# Patient Record
Sex: Male | Born: 1995 | Hispanic: No | Marital: Single | Smoking: Current some day smoker
Health system: Southern US, Community
[De-identification: ages and names within clinical notes are randomized; demographics above are authoritative.]

## PROBLEM LIST (undated history)

## (undated) DIAGNOSIS — J45909 Unspecified asthma, uncomplicated: Secondary | ICD-10-CM

## (undated) DIAGNOSIS — J982 Interstitial emphysema: Principal | ICD-10-CM

## (undated) DIAGNOSIS — E876 Hypokalemia: Secondary | ICD-10-CM

## (undated) HISTORY — PX: ESOPHAGOGASTRODUODENOSCOPY: SHX1529

---
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
## 2018-03-19 IMAGING — CT CT CHEST W/ CM
2 of 3 series · 15 of 36 positions shown, 18 images · IV contrast (APPLIED)
Comparison: Chest radiograph April 08, 2016

CLINICAL DATA: Recent respiratory tract infection, vomited today.
Severe chest and LEFT neck pain, throat swelling. Follow-up
pneumomediastinum.

EXAM:
CT NECK WITH CONTRAST
CT CHEST WITH CONTRAST
TECHNIQUE: Multidetector CT imaging of the neck and chest was performed using
the standard protocol following the bolus administration of
intravenous contrast.
CONTRAST:  75mL EQ59IC-H99 IOPAMIDOL (EQ59IC-H99) INJECTION 61%

[Series 4: thorax · axial · 0.62mm/px · z∈[-464,-192]mm · 12 of 160 slices shown, 15 images]
[im 12/160  mediastinal]
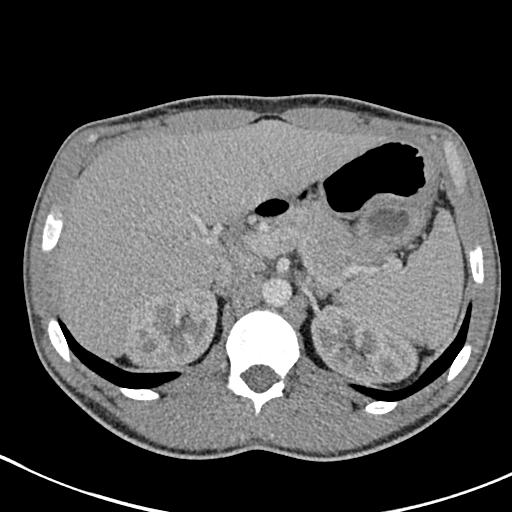
[im 12/160  lung]
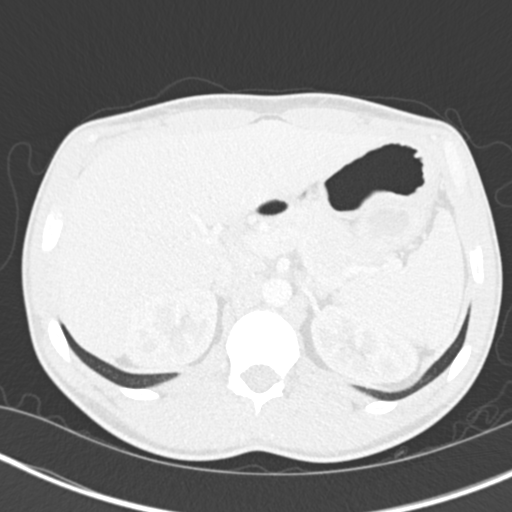
[im 24/160  lung]
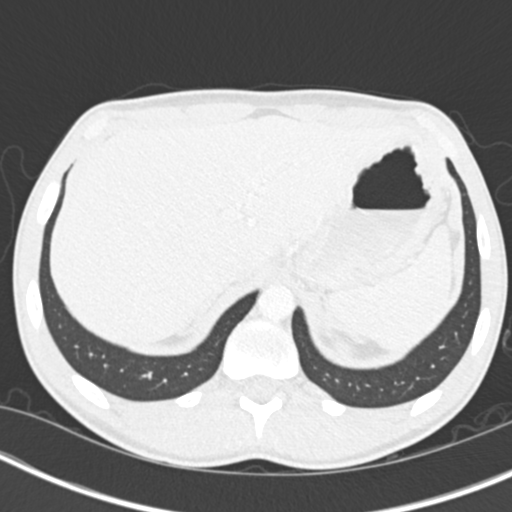
[im 36/160  lung]
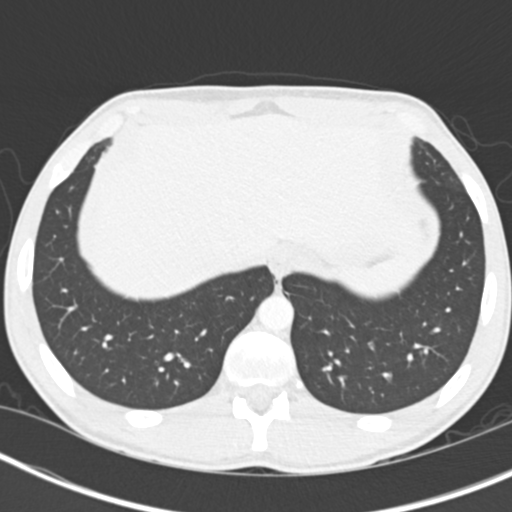
[im 48/160  lung]
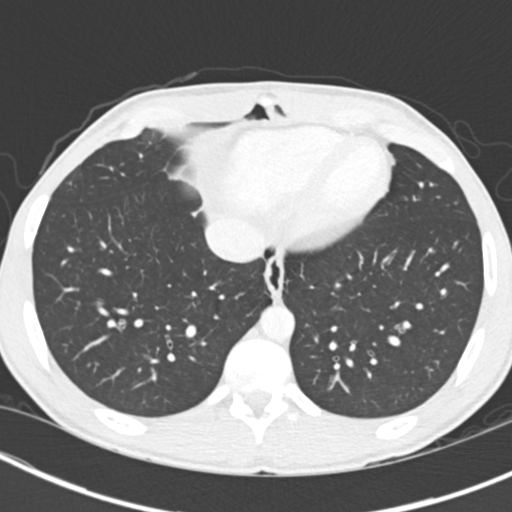
[im 59/160  mediastinal]
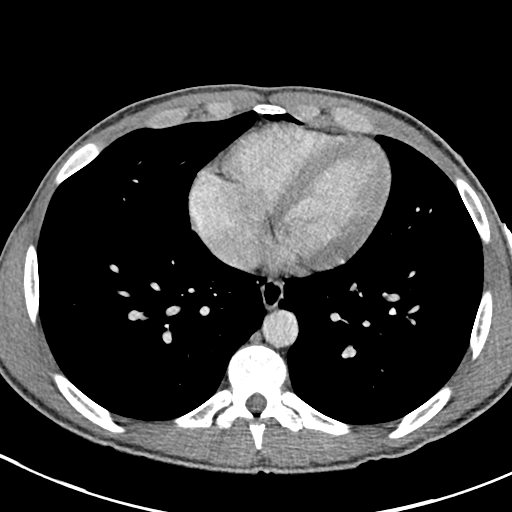
[im 59/160  lung]
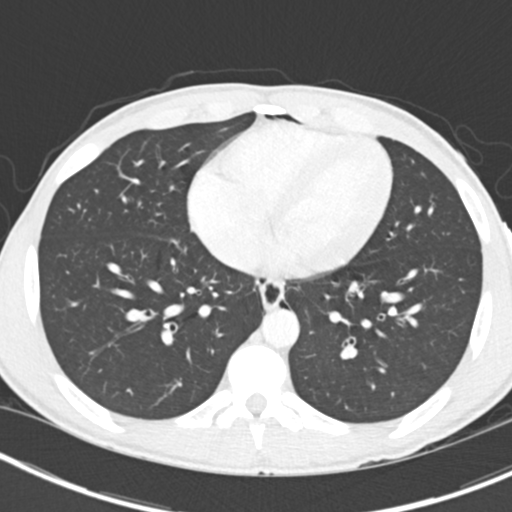
[im 71/160  lung]
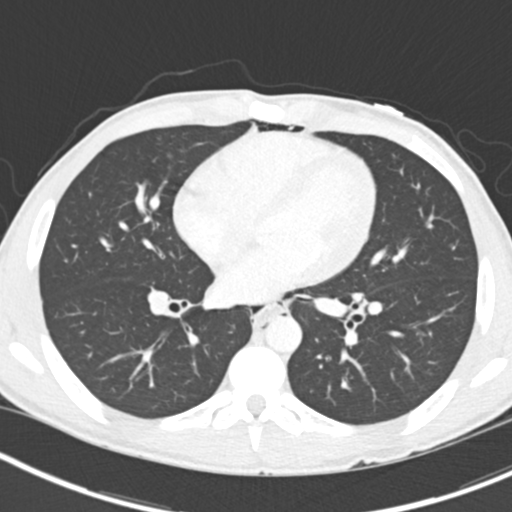
[im 89/160  lung]
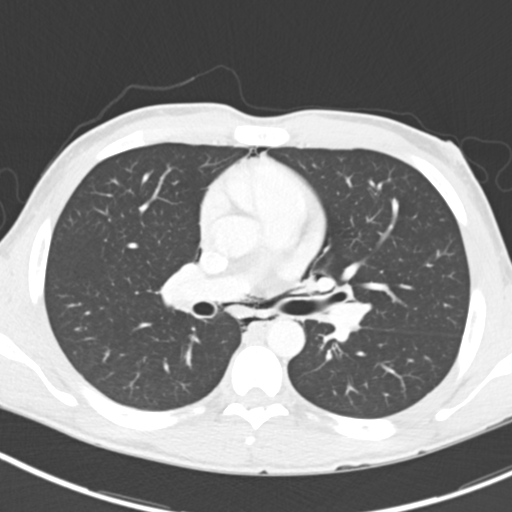
[im 101/160  lung]
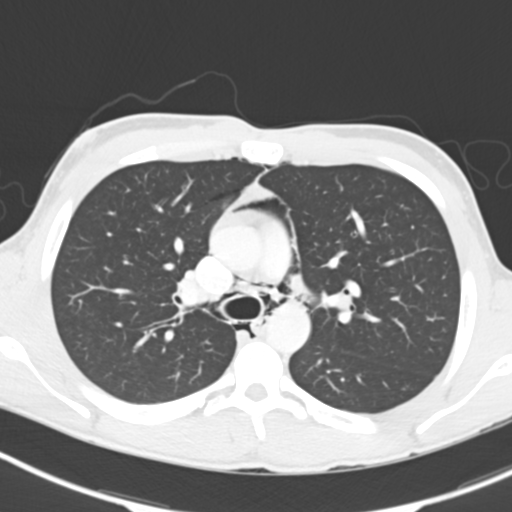
[im 112/160  mediastinal]
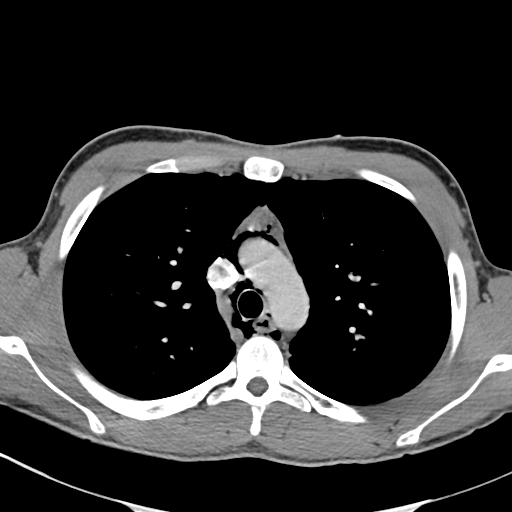
[im 112/160  lung]
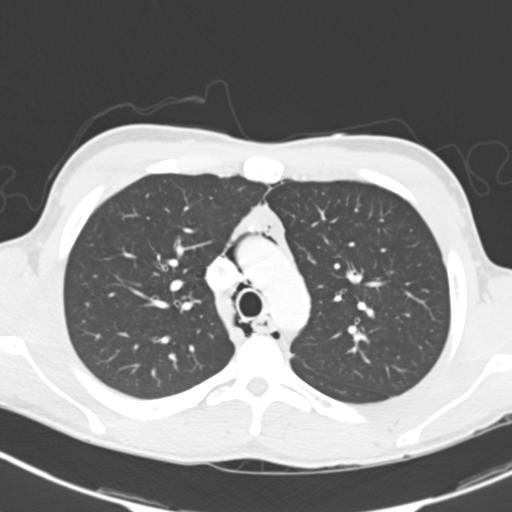
[im 124/160  lung]
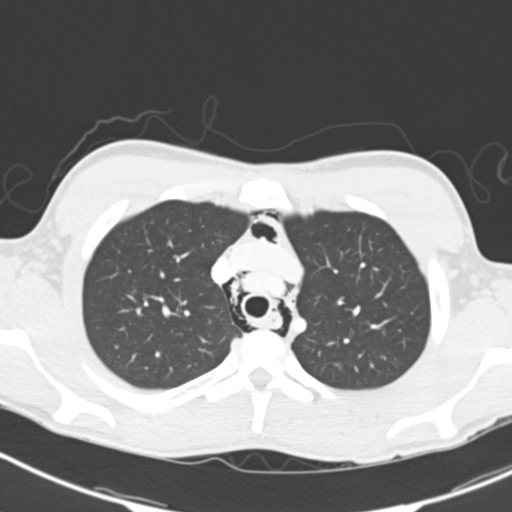
[im 136/160  lung]
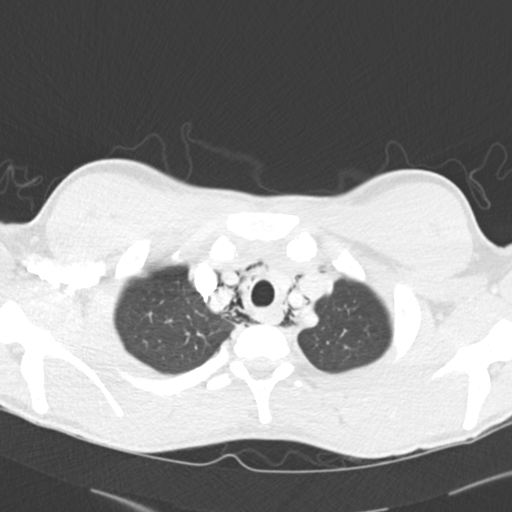
[im 148/160  lung]
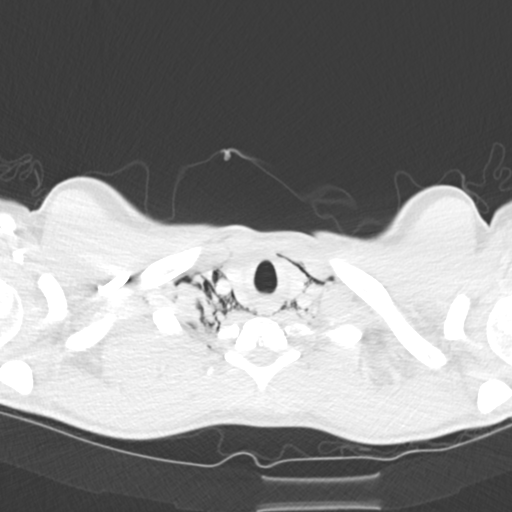

[Series 7: coronal · coronal · 0.65mm/px · 3 of 126 slices shown]
[im 26/126  lung]
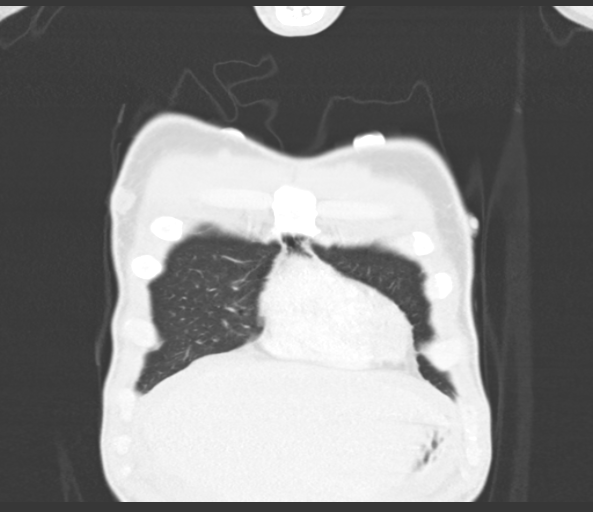
[im 51/126  lung]
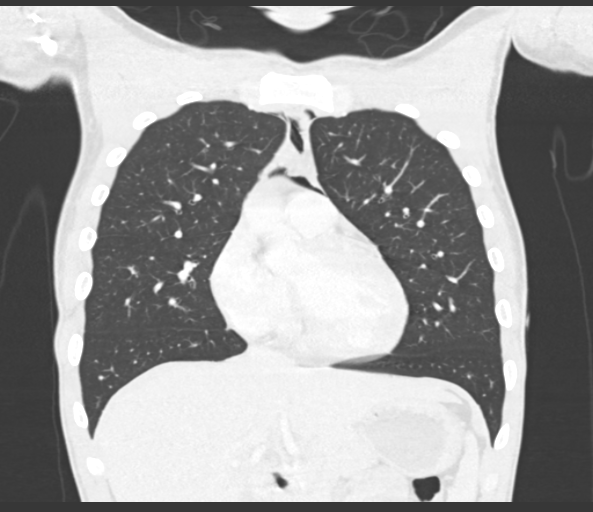
[im 76/126  lung]
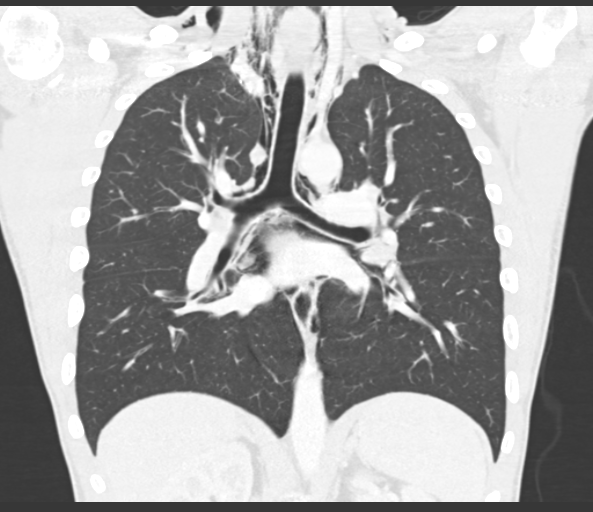

[15 of 36 positions shown; findings below may reference images not displayed]

FINDINGS: CT CHEST FINDINGS
CARDIOVASCULAR: Heart and pericardium are unremarkable. Thoracic
aorta is normal course and caliber, unremarkable.

MEDIASTINUM/NODES: Moderate Mountain pneumomediastinum with
throughout all compartments of the mediastinum. Probable is a gas
along the margin of the mid to distal esophagus with small distal
esophagus air-fluid level. No mediastinal mass, small amount of
residual thymic tissue. No lymphadenopathy by CT size criteria.

LUNGS/PLEURA: Tracheobronchial tree is patent, mild bronchial wall
thickening. No pneumothorax. No pleural effusions, focal
consolidations, pulmonary nodules or masses.

UPPER ABDOMEN: Nonacute.

MUSCULOSKELETAL: Unfused sternum with mild reactive changes.

CT NECK FINDINGS

PHARYNX AND LARYNX: Fullness of the adenoidal soft tissues and
palatine soft tissues in keeping with patient's provided young age.

SALIVARY GLANDS: Normal.

THYROID: Normal.

LYMPH NODES: Bilateral enlarged level IIa lymph nodes up to 19 mm
short access are likely reactive.

VASCULAR: Normal.

LIMITED INTRACRANIAL: Normal.

VISUALIZED ORBITS: Normal.

MASTOIDS AND VISUALIZED PARANASAL SINUSES: Mild RIGHT maxillary
sinusitis. Mild ethmoid and sphenoid mucosal thickening. Mastoid air
cells are well aerated.

SKELETON: Large LEFT incisor foramen cyst anteriorly displaces the
10th and 11th teeth. Straightened cervical lordosis.

OTHER: Moderate anterior neck subcutaneous emphysema tracking from
pneumomediastinum to the level the skullbase.
IMPRESSION: CT CHEST: Moderate pneumomediastinum. Possible mid to distal
esophageal injury versus artifact.

Bronchial wall thickening can be seen with bronchitis or reactive
airway disease.

Unfused sternum, with mild reactive changes.

CT NECK: Anterior neck subcutaneous gas tracking from
pneumomediastinum. Patent airway.

Mild lymphadenopathy is likely reactive.

LEFT incisor foramen cyst displaces the 10th and 11th teeth.
Recommend dental examination on non emergent basis.

## 2018-09-19 IMAGING — CT CT NECK W/ CM
3 of 4 series · 13 of 33 positions shown, 16 images · IV contrast (APPLIED)
Comparison: Chest radiograph April 08, 2016

CLINICAL DATA: Recent respiratory tract infection, vomited today.
Severe chest and LEFT neck pain, throat swelling. Follow-up
pneumomediastinum.

EXAM:
CT NECK WITH CONTRAST
CT CHEST WITH CONTRAST
TECHNIQUE: Multidetector CT imaging of the neck and chest was performed using
the standard protocol following the bolus administration of
intravenous contrast.
CONTRAST:  75mL EQ59IC-H99 IOPAMIDOL (EQ59IC-H99) INJECTION 61%

[Series 6: coronal st · coronal · 0.39mm/px · 3 of 99 slices shown]
[im 20/99  bone]
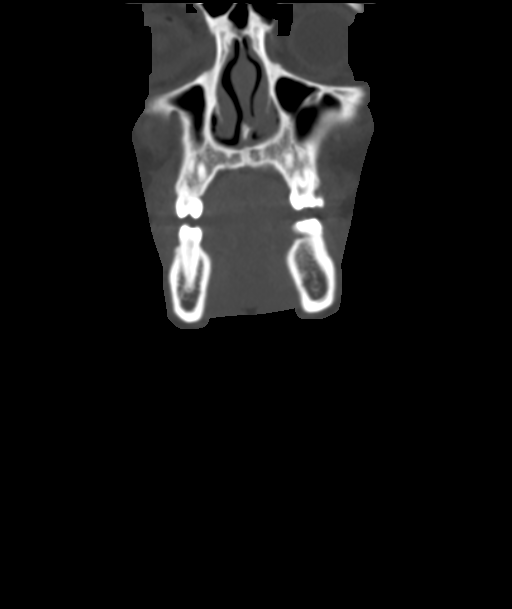
[im 40/99  bone]
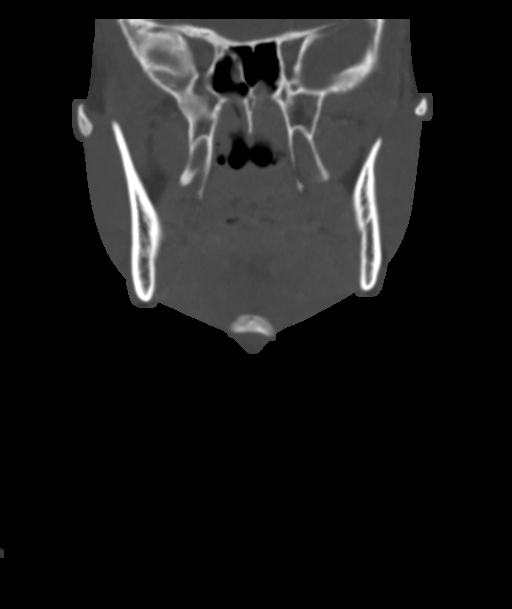
[im 59/99  bone]
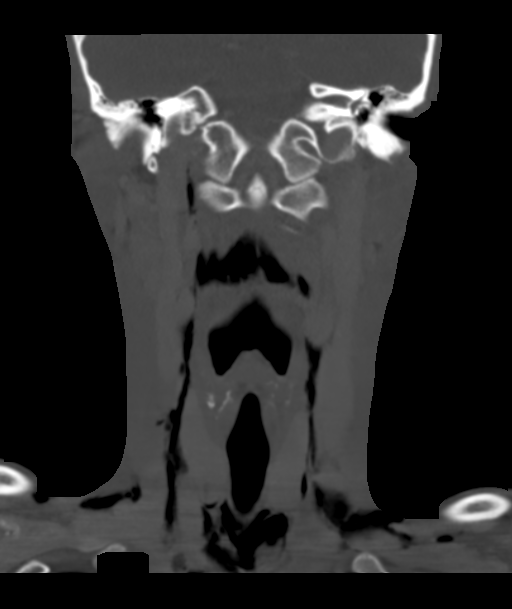

[Series 7: sagittal st · sagittal · 0.38mm/px · 5 of 84 slices shown, 6 images]
[im 28/84  bone]
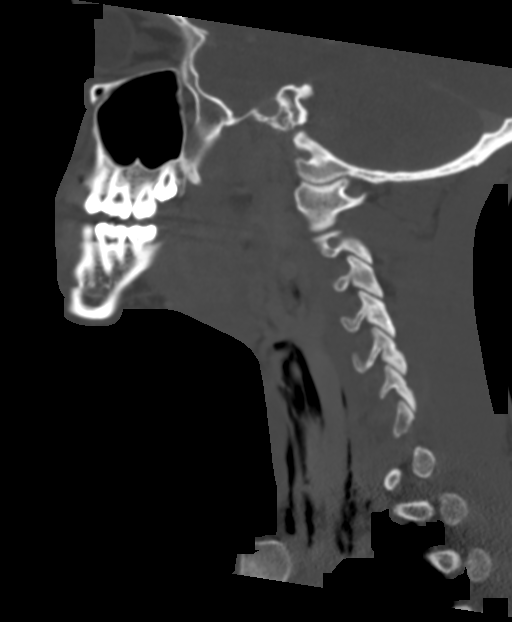
[im 35/84  bone]
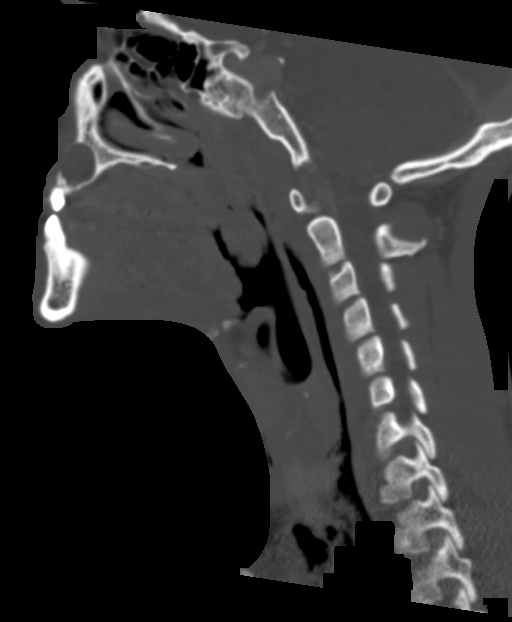
[im 42/84  soft-tissue]
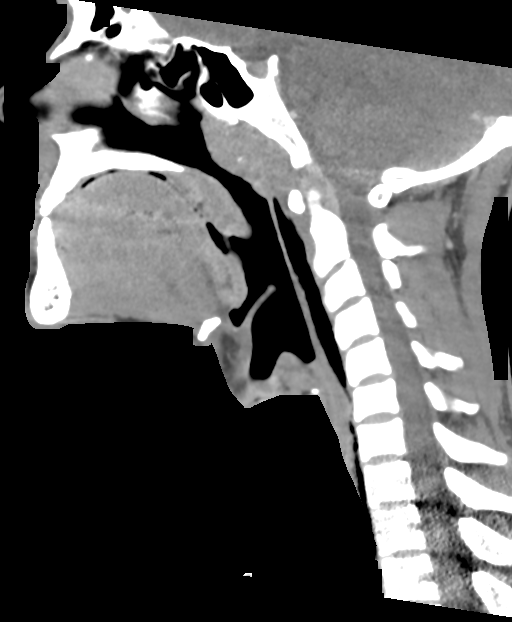
[im 42/84  bone]
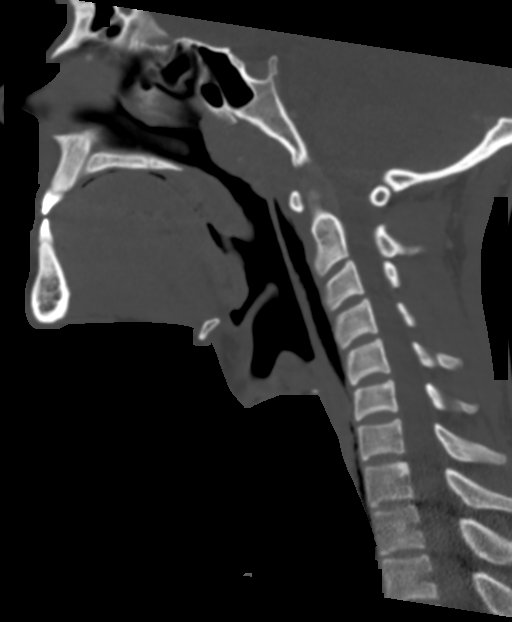
[im 49/84  bone]
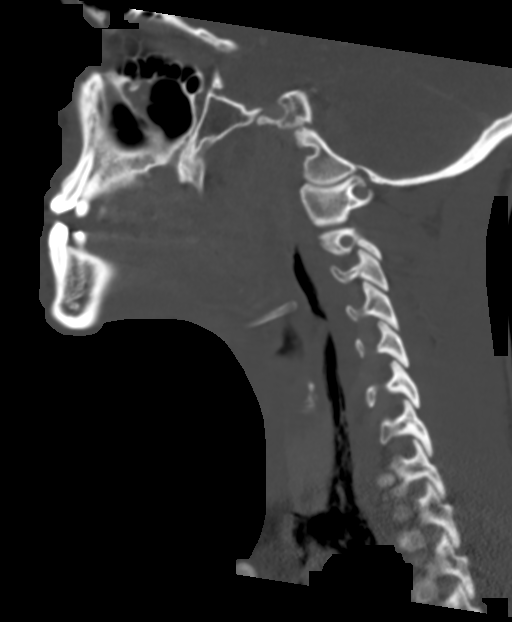
[im 56/84  bone]
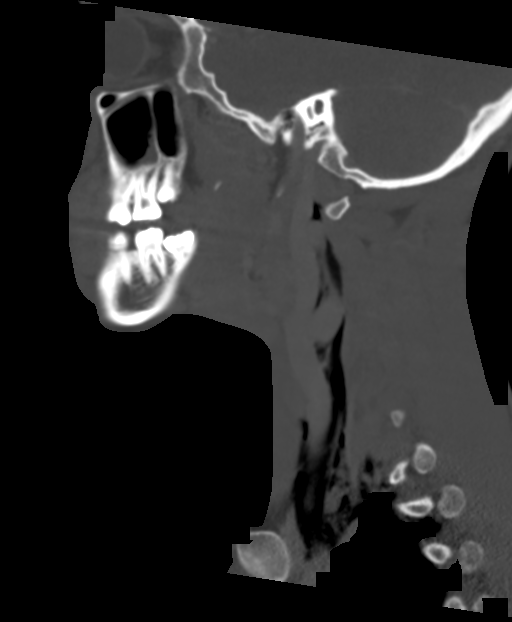

[Series 8: orthogonal st · axial · 0.38mm/px · z∈[-224,-70]mm · 5 of 118 slices shown, 7 images]
[im 20/118  soft-tissue]
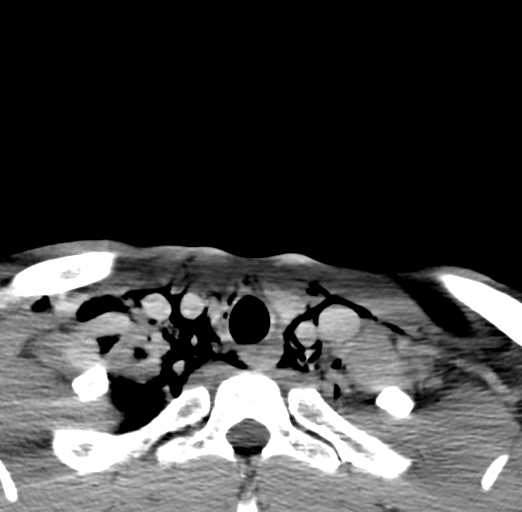
[im 20/118  bone]
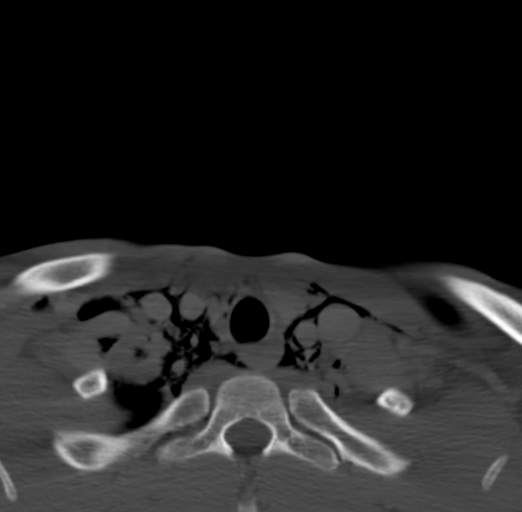
[im 40/118  bone]
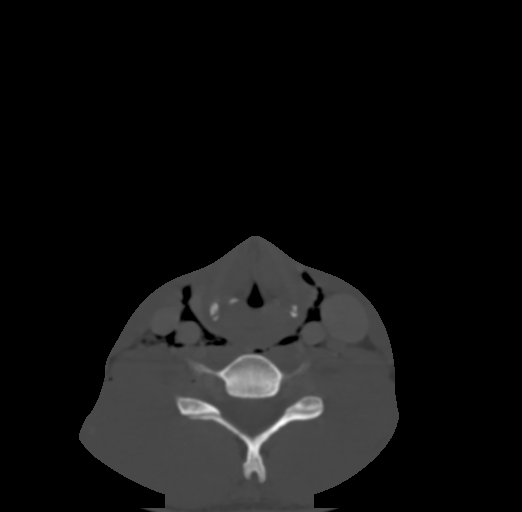
[im 59/118  bone]
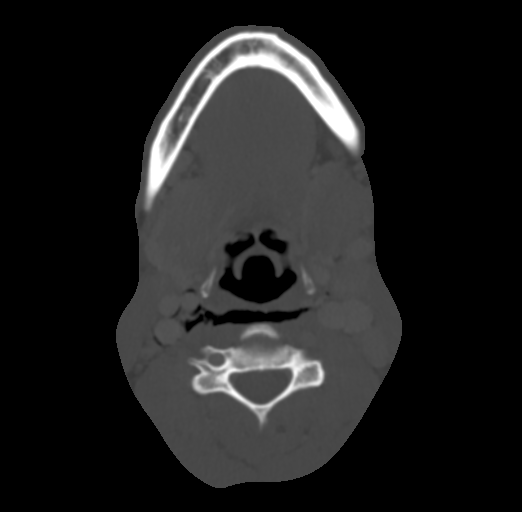
[im 79/118  bone]
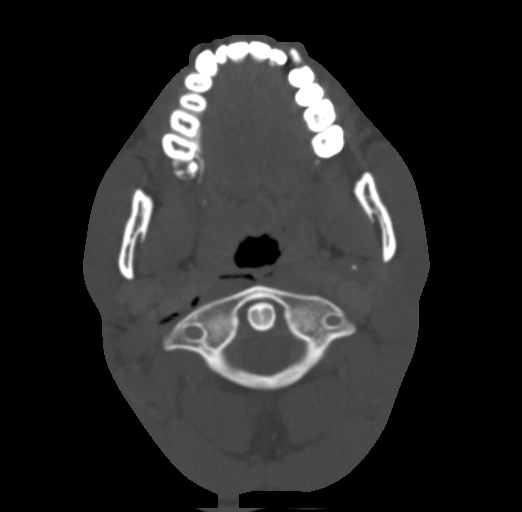
[im 98/118  soft-tissue]
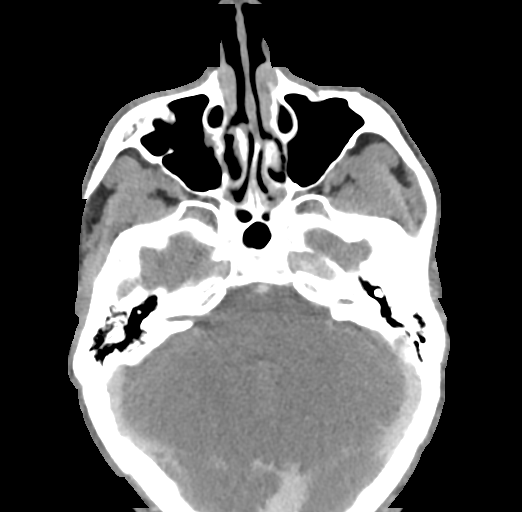
[im 98/118  bone]
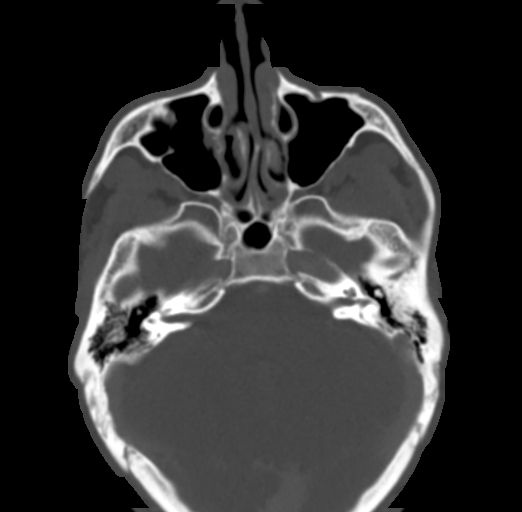

[13 of 33 positions shown; findings below may reference images not displayed]

FINDINGS: CT CHEST FINDINGS
CARDIOVASCULAR: Heart and pericardium are unremarkable. Thoracic
aorta is normal course and caliber, unremarkable.

MEDIASTINUM/NODES: Moderate [place] pneumomediastinum with
throughout all compartments of the mediastinum. Probable is a gas
along the margin of the mid to distal esophagus with small distal
esophagus air-fluid level. No mediastinal mass, small amount of
residual thymic tissue. No lymphadenopathy by CT size criteria.

LUNGS/PLEURA: Tracheobronchial tree is patent, mild bronchial wall
thickening. No pneumothorax. No pleural effusions, focal
consolidations, pulmonary nodules or masses.

UPPER ABDOMEN: Nonacute.

MUSCULOSKELETAL: Unfused sternum with mild reactive changes.

CT NECK FINDINGS

PHARYNX AND LARYNX: Fullness of the adenoidal soft tissues and
palatine soft tissues in keeping with patient's provided young age.

SALIVARY GLANDS: Normal.

THYROID: Normal.

LYMPH NODES: Bilateral enlarged level IIa lymph nodes up to 19 mm
short access are likely reactive.

VASCULAR: Normal.

LIMITED INTRACRANIAL: Normal.

VISUALIZED ORBITS: Normal.

MASTOIDS AND VISUALIZED PARANASAL SINUSES: Mild RIGHT maxillary
sinusitis. Mild ethmoid and sphenoid mucosal thickening. Mastoid air
cells are well aerated.

SKELETON: Large LEFT incisor foramen cyst anteriorly displaces the
10th and 11th teeth. Straightened cervical lordosis.

OTHER: Moderate anterior neck subcutaneous emphysema tracking from
pneumomediastinum to the level the skullbase.
IMPRESSION: CT CHEST: Moderate pneumomediastinum. Possible mid to distal
esophageal injury versus artifact.

Bronchial wall thickening can be seen with bronchitis or reactive
airway disease.

Unfused sternum, with mild reactive changes.

CT NECK: Anterior neck subcutaneous gas tracking from
pneumomediastinum. Patent airway.

Mild lymphadenopathy is likely reactive.

LEFT incisor foramen cyst displaces the 10th and 11th teeth.
Recommend dental examination on non emergent basis.

## 2020-11-14 ENCOUNTER — Ambulatory Visit (INDEPENDENT_AMBULATORY_CARE_PROVIDER_SITE_OTHER): Payer: BC Managed Care – PPO

## 2020-11-14 ENCOUNTER — Ambulatory Visit (HOSPITAL_COMMUNITY)
Admission: EM | Admit: 2020-11-14 | Discharge: 2020-11-14 | Disposition: A | Discharge status: Home / Self Care | Payer: BC Managed Care – PPO | Attending: Emergency Medicine | Admitting: Emergency Medicine

## 2020-11-14 ENCOUNTER — Encounter (HOSPITAL_COMMUNITY): Payer: Self-pay

## 2020-11-14 DIAGNOSIS — S6991XA Unspecified injury of right wrist, hand and finger(s), initial encounter: Secondary | ICD-10-CM | POA: Diagnosis not present

## 2020-11-14 DIAGNOSIS — M79641 Pain in right hand: Secondary | ICD-10-CM

## 2020-11-14 DIAGNOSIS — R2231 Localized swelling, mass and lump, right upper limb: Secondary | ICD-10-CM

## 2020-11-14 HISTORY — DX: Hypokalemia: E87.6

## 2020-11-14 MED ORDER — DOXYCYCLINE HYCLATE 100 MG PO CAPS: 100.0000 mg | ORAL_CAPSULE | Freq: Two times a day (BID) | ORAL | 0 refills | Status: DC

## 2020-11-14 MED ORDER — HYDROCODONE-ACETAMINOPHEN 5-325 MG PO TABS: 2.0000 | ORAL_TABLET | ORAL | 0 refills | Status: DC | PRN

## 2020-11-14 NOTE — ED Triage Notes (Signed)
Pt reports had boxer fracture right wrist in March and got cast in April, out of it 11/03/20. 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 Pt has been having pain and swelling on back of hand since.

## 2020-11-14 NOTE — Progress Notes (Signed)
Orthopedic Tech Progress Note Patient Details:  [place] 1996/04/14 [id number]  Ortho Devices Type of Ortho Device: Ulna gutter splint Ortho Device/Splint Location: Right Upper Extremity Ortho Device/Splint Interventions: Ordered,Application   Post Interventions Patient Tolerated: Well Instructions Provided: Care of device,Poper ambulation with device   Joseph Dunn [name] 11/14/2020, 6:54 PM

## 2020-11-14 NOTE — ED Notes (Signed)
Ortho tech at bedside 

## 2020-11-14 NOTE — ED Provider Notes (Signed)
[facility]    CSN: [id number] Arrival date & time: 11/14/20  1737      History   Chief Complaint Chief Complaint  Patient presents with  . Hand Pain    HPI Joseph Dunn is a 25 y.o. male.   HPI   Hand Pain: Patient reports that he had a boxer's fracture of his right wrist in March.  He had his cast removed on 11/03/2020.  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  Since then he has had some pain and swelling of the back of the hand.  Pain rated 10/10. He has tried ice for symptoms. He wants to ensure that he has not re-fractured his hand. He is UTD on his Tdap.   Past Medical History:  Diagnosis Date  . Hypokalemia    history of low potassium per pt    Patient Active Problem List   Diagnosis Date Noted  . Esophageal rupture   . Pneumomediastinum (HCC) 04/09/2016  . Strep throat 04/09/2016    Past Surgical History:  Procedure Laterality Date  . ESOPHAGOGASTRODUODENOSCOPY     per pt report       Home Medications    Prior to Admission medications   Medication Sig Start Date End Date Taking? Authorizing Provider  amoxicillin-clavulanate (AUGMENTIN) 875-125 MG tablet Take 1 tablet by mouth 2 (two) times daily. 04/13/16   Richarda Overlie, MD  benzocaine-menthol (SORE THROAT LOZENGES) 6-10 MG lozenge Take 1 lozenge by mouth as needed for sore throat. 04/13/16   Richarda Overlie, MD  guaiFENesin-dextromethorphan (ROBITUSSIN DM) 100-10 MG/5ML syrup Take 10 mLs by mouth every 4 (four) hours as needed for cough. 04/13/16   Richarda Overlie, MD  pantoprazole (PROTONIX) 40 MG tablet Take 1 tablet (40 mg total) by mouth daily. 04/13/16   Richarda Overlie, MD  potassium chloride (K-DUR) 10 MEQ tablet Take 2 tablets (20 mEq total) by mouth daily. 04/13/16   Richarda Overlie, MD    Family History Family History  Problem Relation Age of Onset  . Healthy Mother     Social History Social History   Tobacco Use   . Smoking status: Current Some Day Smoker    Years: 2.00    Types: E-cigarettes, Cigarettes  . Smokeless tobacco: Never Used  Vaping Use  . Vaping Use: Every day  Substance Use Topics  . Alcohol use: No  . Drug use: No     Allergies   Latex   Review of Systems Review of Systems  As stated above in HPI Physical Exam Triage Vital Signs ED Triage Vitals  Enc Vitals Group     BP 11/14/20 1801 114/74     Pulse Rate 11/14/20 1801 94     Resp 11/14/20 1801 18     Temp 11/14/20 1801 98.6 F (37 C)     Temp src --      SpO2 11/14/20 1801 97 %     Weight --      Height --      Head Circumference --      Peak Flow --      Pain Score 11/14/20 1757 9     Pain Loc --      Pain Edu? --      Excl. in GC? --    No data found.  Updated Vital Signs BP 114/74   Pulse 94   Temp 98.6 F (37 C)  Resp 18   SpO2 97%   Physical Exam Vitals and nursing note reviewed.  Constitutional:      General: He is not in acute distress.    Appearance: Normal appearance. He is not ill-appearing, toxic-appearing or diaphoretic.  HENT:     Head: Normocephalic and atraumatic.  Cardiovascular:     Rate and Rhythm: Normal rate and regular rhythm.     Pulses: Normal pulses.     Heart sounds: Normal heart sounds.  Pulmonary:     Breath sounds: Normal breath sounds.  Musculoskeletal:        General: Swelling (right hand) and tenderness (right hand) present. Normal range of motion.  Lymphadenopathy:     Cervical: No cervical adenopathy.  Skin:    General: Skin is warm.     Capillary Refill: Capillary refill takes less than 2 seconds.     Findings: Erythema present.     Comments: Increased warmth of the right hand  Neurological:     General: No focal deficit present.     Mental Status: He is alert.     Sensory: No sensory deficit.      UC Treatments / Results  Labs (all labs ordered are listed, but only abnormal results are displayed) Labs Reviewed - No data to  display  EKG   Radiology No results found.  Procedures Procedures (including critical care time)  Medications Ordered in UC Medications - No data to display  Initial Impression / Assessment and Plan / UC Course  I have reviewed the triage vital signs and the nursing notes.  Pertinent labs & imaging results that were available during my care of the patient were reviewed by me and considered in my medical decision making (see chart for details).     New. X ray pending.   Update: X-ray suggestive of acute on chronic fracture of the right hand but patient's area of most pain occurs near areas of resolving scab formation.  I am concerned that he may have cellulitis on top of the acute on chronic fracture.  Osteomyelitis not seen on x-ray.  I am going to treat him with doxycycline and treat him as a acute on chronic fracture until he can be seen by orthopedics for follow up. Discussed red flag signs and symptoms. Norco discussed and PMPD discussed.   Final Clinical Impressions(s) / UC Diagnoses   Final diagnoses:  None   Discharge Instructions   None    ED Prescriptions    None     PDMP not reviewed this encounter.   Rushie Chestnut, [place] 11/14/20 1824

## 2021-10-12 ENCOUNTER — Encounter (INDEPENDENT_AMBULATORY_CARE_PROVIDER_SITE_OTHER): Payer: BC Managed Care – PPO | Admitting: Physician Assistant

## 2021-10-12 ENCOUNTER — Encounter: Payer: Self-pay | Admitting: Physician Assistant

## 2021-10-12 ENCOUNTER — Ambulatory Visit (INDEPENDENT_AMBULATORY_CARE_PROVIDER_SITE_OTHER): Payer: BC Managed Care – PPO | Admitting: Physician Assistant

## 2021-10-12 DIAGNOSIS — J4521 Mild intermittent asthma with (acute) exacerbation: Secondary | ICD-10-CM

## 2021-10-12 DIAGNOSIS — J302 Other seasonal allergic rhinitis: Secondary | ICD-10-CM

## 2021-10-12 MED ORDER — CETIRIZINE HCL 10 MG PO TABS: 10.0000 mg | ORAL_TABLET | Freq: Every day | ORAL | 11 refills | Status: AC

## 2021-10-12 MED ORDER — ALBUTEROL SULFATE HFA 108 (90 BASE) MCG/ACT IN AERS: 2.0000 | INHALATION_SPRAY | Freq: Four times a day (QID) | RESPIRATORY_TRACT | 0 refills | Status: AC | PRN

## 2021-10-12 MED ORDER — BENZONATATE 200 MG PO CAPS: 200.0000 mg | ORAL_CAPSULE | Freq: Two times a day (BID) | ORAL | 0 refills | Status: DC | PRN

## 2021-10-12 MED ORDER — AZITHROMYCIN 250 MG PO TABS: ORAL_TABLET | ORAL | 0 refills | Status: DC

## 2021-10-12 NOTE — Progress Notes (Signed)
? ?New Patient Office Visit ? ?Subjective:  ?Patient ID: Joseph Dunn, male    DOB: 1995/11/05  Age: 26 y.o. MRN: [id number] ? ?CC:  ?Chief Complaint  ?Patient presents with  ? URI  ? ?Virtual Visit via Telephone Note ? ?I connected with Joseph Dunn on 10/12/21 at  3:40 PM EDT by telephone and verified that I am speaking with the correct person using two identifiers. ? ?Location: ?Patient: Home ?Provider: Primary Care at [facility] ?  ?I discussed the limitations, risks, security and privacy concerns of performing an evaluation and management service by telephone and the availability of in person appointments. I also discussed with the patient that there may be a patient responsible charge related to this service. The patient expressed understanding and agreed to proceed. ? ? ?History of Present Illness: ? ?Joseph Dunn reports that he has been having a mostly dry cough for the last couple of days, states occasionally he will cough up clear sputum.  States the cough "is bad" and it is keeping him awake.  States that he has not had any other upper respiratory symptoms.  Denies any sick contacts, states he did have a negative home COVID test. ? ?States that he has been eating and drinking okay, has been using NyQuil without relief. ? ?Does endorse significant history of asthma, states that he has been without an inhaler for a couple of years. ?  ?Observations/Objective: ?Medical history and current medications reviewed, no physical exam completed ? ? ? ? ?Past Medical History:  ?Diagnosis Date  ? Hypokalemia   ? history of low potassium per pt  ? ? ?Past Surgical History:  ?Procedure Laterality Date  ? ESOPHAGOGASTRODUODENOSCOPY    ? per pt report  ? ? ?Family History  ?Problem Relation Age of Onset  ? Healthy Mother   ? ? ?Social History  ? ?Socioeconomic History  ? Marital status: Single  ?  Spouse name: Not on file  ? Number of children: Not on file  ? Years of education: Not on file  ? Highest education  level: Not on file  ?Occupational History  ? Not on file  ?Tobacco Use  ? Smoking status: Some Days  ?  Years: 2.00  ?  Types: E-cigarettes, Cigarettes  ? Smokeless tobacco: Never  ?Vaping Use  ? Vaping Use: Every day  ?Substance and Sexual Activity  ? Alcohol use: No  ? Drug use: No  ? Sexual activity: Not on file  ?Other Topics Concern  ? Not on file  ?Social History Narrative  ? Not on file  ? ?Social Determinants of Health  ? ?Financial Resource Strain: Not on file  ?Food Insecurity: Not on file  ?Transportation Needs: Not on file  ?Physical Activity: Not on file  ?Stress: Not on file  ?Social Connections: Not on file  ?Intimate Partner Violence: Not on file  ? ? ?ROS ?Review of Systems  ?Constitutional:  Negative for chills and fever.  ?HENT:  Negative for ear pain, sinus pressure, sinus pain, sneezing and sore throat.   ?Respiratory:  Positive for cough, shortness of breath and wheezing.   ?Cardiovascular:  Negative for chest pain.  ?Gastrointestinal:  Negative for diarrhea, nausea and vomiting.  ?Endocrine: Negative.   ?Genitourinary: Negative.   ?Musculoskeletal:  Negative for myalgias.  ?Skin: Negative.   ?Allergic/Immunologic: Negative.   ?Neurological:  Positive for headaches.  ?Hematological: Negative.   ?Psychiatric/Behavioral: Negative.    ? ?Objective:  ? ?Today's Vitals: There were no vitals taken for this  visit. ? ?Physical Exam ? ?Assessment & Plan:  ? ?Problem List Items Addressed This Visit   ?None ?Visit Diagnoses   ? ? Mild intermittent asthma with acute exacerbation    -  Primary  ? Relevant Medications  ? albuterol (VENTOLIN HFA) 108 (90 Base) MCG/ACT inhaler  ? cetirizine (ZYRTEC) 10 MG tablet  ? azithromycin (ZITHROMAX) 250 MG tablet  ? benzonatate (TESSALON) 200 MG capsule  ? Seasonal allergies      ? Relevant Medications  ? cetirizine (ZYRTEC) 10 MG tablet  ? ?  ? ? ?Outpatient Encounter Medications as of 10/12/2021  ?Medication Sig  ? albuterol (VENTOLIN HFA) 108 (90 Base) MCG/ACT inhaler  Inhale 2 puffs into the lungs every 6 (six) hours as needed for wheezing or shortness of breath.  ? azithromycin (ZITHROMAX) 250 MG tablet Take 2 tabs PO day 1, then take 1 tab PO once daily  ? benzonatate (TESSALON) 200 MG capsule Take 1 capsule (200 mg total) by mouth 2 (two) times daily as needed for cough.  ? cetirizine (ZYRTEC) 10 MG tablet Take 1 tablet (10 mg total) by mouth daily.  ? [DISCONTINUED] pantoprazole (PROTONIX) 40 MG tablet Take 1 tablet (40 mg total) by mouth daily.  ? ?No facility-administered encounter medications on file as of 10/12/2021.  ? ? ?Assessment and Plan: ? ? ? ? ?1. Mild intermittent asthma with acute exacerbation ?Trial albuterol inhaler, Zyrtec, azithromycin, Tessalon Perles.  Patient education given on supportive care.  Red flags given for prompt reevaluation. ? ?Patient was given an appointment to establish care at Primary Care at [facility]. ?- albuterol (VENTOLIN HFA) 108 (90 Base) MCG/ACT inhaler; Inhale 2 puffs into the lungs every 6 (six) hours as needed for wheezing or shortness of breath.  Dispense: 8 g; Refill: 0 ?- cetirizine (ZYRTEC) 10 MG tablet; Take 1 tablet (10 mg total) by mouth daily.  Dispense: 30 tablet; Refill: 11 ?- azithromycin (ZITHROMAX) 250 MG tablet; Take 2 tabs PO day 1, then take 1 tab PO once daily  Dispense: 6 tablet; Refill: 0 ?- benzonatate (TESSALON) 200 MG capsule; Take 1 capsule (200 mg total) by mouth 2 (two) times daily as needed for cough.  Dispense: 20 capsule; Refill: 0 ? ?2. Seasonal allergies ? ?- cetirizine (ZYRTEC) 10 MG tablet; Take 1 tablet (10 mg total) by mouth daily.  Dispense: 30 tablet; Refill: 11 ? ? ?Follow Up Instructions: ? ?  ?I discussed the assessment and treatment plan with the patient. The patient was provided an opportunity to ask questions and all were answered. The patient agreed with the plan and demonstrated an understanding of the instructions. ?  ?The patient was advised to call back or seek an in-person evaluation if  the symptoms worsen or if the condition fails to improve as anticipated. ? ?I provided 16 minutes of non-face-to-face time during this encounter. ? ? ? ?Follow-up: Return if symptoms worsen or fail to improve.  ? ?Orla Jolliff S Mayers, PA-C ? ?

## 2021-10-12 NOTE — Progress Notes (Deleted)
? ?New Patient Office Visit ? ?Subjective:  ?Patient ID: Joseph Dunn, male    DOB: 10-23-1995  Age: 26 y.o. MRN: [id number] ? ?CC: No chief complaint on file. ? ? ?Virtual Visit via Telephone Note ? ?I connected with Joseph Dunn on 10/12/21 at  3:20 PM EDT by telephone and verified that I am speaking with the correct person using two identifiers. ? ?Location: ?Patient: Home   ?Provider: Primary Care at [facility] ?  ?I discussed the limitations, risks, security and privacy concerns of performing an evaluation and management service by telephone and the availability of in person appointments. I also discussed with the patient that there may be a patient responsible charge related to this service. The patient expressed understanding and agreed to proceed. ? ? ?History of Present Illness: ? ?  ?Observations/Objective: ?Medical history and current medications reviewed, no physical exam completed ? ? ?Assessment and Plan: ? ? ?Follow Up Instructions: ? ?  ?I discussed the assessment and treatment plan with the patient. The patient was provided an opportunity to ask questions and all were answered. The patient agreed with the plan and demonstrated an understanding of the instructions. ?  ?The patient was advised to call back or seek an in-person evaluation if the symptoms worsen or if the condition fails to improve as anticipated. ? ?I provided *** minutes of non-face-to-face time during this encounter. ? ? ?Jahir Halt S Mayers, PA-C ? ? ?HPI ?Joseph Dunn presents for *** ? ?Past Medical History:  ?Diagnosis Date  ? Hypokalemia   ? history of low potassium per pt  ? ? ?Past Surgical History:  ?Procedure Laterality Date  ? ESOPHAGOGASTRODUODENOSCOPY    ? per pt report  ? ? ?Family History  ?Problem Relation Age of Onset  ? Healthy Mother   ? ? ?Social History  ? ?Socioeconomic History  ? Marital status: Single  ?  Spouse name: Not on file  ? Number of children: Not on file  ? Years of education: Not on file  ? Highest  education level: Not on file  ?Occupational History  ? Not on file  ?Tobacco Use  ? Smoking status: Some Days  ?  Years: 2.00  ?  Types: E-cigarettes, Cigarettes  ? Smokeless tobacco: Never  ?Vaping Use  ? Vaping Use: Every day  ?Substance and Sexual Activity  ? Alcohol use: No  ? Drug use: No  ? Sexual activity: Not on file  ?Other Topics Concern  ? Not on file  ?Social History Narrative  ? Not on file  ? ?Social Determinants of Health  ? ?Financial Resource Strain: Not on file  ?Food Insecurity: Not on file  ?Transportation Needs: Not on file  ?Physical Activity: Not on file  ?Stress: Not on file  ?Social Connections: Not on file  ?Intimate Partner Violence: Not on file  ? ? ?ROS ?Review of Systems ? ?Objective:  ? ?Today's Vitals: There were no vitals taken for this visit. ? ?Physical Exam ? ?Assessment & Plan:  ? ?Problem List Items Addressed This Visit   ?None ? ? ?Outpatient Encounter Medications as of 10/12/2021  ?Medication Sig  ? doxycycline (VIBRAMYCIN) 100 MG capsule Take 1 capsule (100 mg total) by mouth 2 (two) times daily.  ? HYDROcodone-acetaminophen (NORCO/VICODIN) 5-325 MG tablet Take 2 tablets by mouth every 4 (four) hours as needed.  ? potassium chloride (K-DUR) 10 MEQ tablet Take 2 tablets (20 mEq total) by mouth daily.  ? [DISCONTINUED] pantoprazole (PROTONIX) 40 MG tablet Take 1 tablet (  40 mg total) by mouth daily.  ? ?No facility-administered encounter medications on file as of 10/12/2021.  ? ? ?Follow-up: No follow-ups on file.  ? ?Fatmata Legere S Mayers, PA-C ? ?

## 2021-10-13 ENCOUNTER — Encounter: Payer: Self-pay | Admitting: Physician Assistant

## 2021-10-13 NOTE — Patient Instructions (Signed)
You are going to start taking azithromycin as directed, start taking Zyrtec on a daily basis, and you can use the Tessalon Perles twice a day as needed and using albuterol inhaler as directed. ? ?I hope that you feel better soon.  Please let us know if anything else we can do for you. ? ?Kennieth Rad, PA-C ?Physician Assistant ?[place] ?[email or web address] ? ? ?Asthma, Adult ?Asthma is a long-term (chronic) condition that causes recurrent episodes in which the airways become tight and narrow. The airways are the passages that lead from the nose and mouth down into the lungs. Asthma episodes, also called asthma attacks, can cause coughing, wheezing, shortness of breath, and chest pain. The airways can also fill with mucus. During an attack, it can be difficult to breathe. Asthma attacks can range from minor to life threatening. ?Asthma cannot be cured, but medicines and lifestyle changes can help control it and treat acute attacks. ?What are the causes? ?This condition is believed to be caused by inherited (genetic) and environmental factors, but its exact cause is not known. ?There are many things that can bring on an asthma attack or make asthma symptoms worse (triggers). Asthma triggers are different for each person. Common triggers include: ?Mold. ?Dust. ?Cigarette smoke. ?Cockroaches. ?Things that can cause allergy symptoms (allergens), such as animal dander or pollen from trees or grass. ?Air pollutants such as household cleaners, wood smoke, smog, or Advertising account planner. ?Cold air, weather changes, and winds (which increase molds and pollen in the air). ?Strong emotional expressions such as crying or laughing hard. ?Stress. ?Certain medicines (such as aspirin) or types of medicines (such as beta-blockers). ?Sulfites in foods and drinks. Foods and drinks that may contain sulfites include dried fruit, potato chips, and sparkling grape juice. ?Infections or  inflammatory conditions such as the flu, a cold, or inflammation of the nasal membranes (rhinitis). ?Gastroesophageal reflux disease (GERD). ?Exercise or strenuous activity. ?What are the signs or symptoms? ?Symptoms of this condition may occur right after asthma is triggered or many hours later. Symptoms include: ?Wheezing. This can sound like whistling when you breathe. ?Excessive nighttime or early morning coughing. ?Frequent or severe coughing with a common cold. ?Chest tightness. ?Shortness of breath. ?Tiredness (fatigue) with minimal activity. ?How is this diagnosed? ?This condition is diagnosed based on: ?Your medical history. ?A physical exam. ?Tests, which may include: ?Lung function studies and pulmonary studies (spirometry). These tests can evaluate the flow of air in your lungs. ?Allergy tests. ?Imaging tests, such as X-rays. ?How is this treated? ?There is no cure for this condition, but treatment can help control your symptoms. Treatment for asthma usually involves: ?Identifying and avoiding your asthma triggers. ?Using medicines to control your symptoms. Generally, two types of medicines are used to treat asthma: ?Controller medicines. These help prevent asthma symptoms from occurring. They are usually taken every day. ?Fast-acting reliever or rescue medicines. These quickly relieve asthma symptoms by widening the narrow and tight airways. They are used as needed and provide short-term relief. ?Using supplemental oxygen. This may be needed during a severe episode. ?Using other medicines, such as: ?Allergy medicines, such as antihistamines, if your asthma attacks are triggered by allergens. ?Immune medicines (immunomodulators). These are medicines that help control the immune system. ?Creating an asthma action plan. An asthma action plan is a written plan for managing and treating your asthma attacks. This plan includes: ?A list of your asthma triggers and how to avoid them. ?Information about when  medicines should be  taken and when their dosage should be changed. ?Instructions about using a device called a peak flow meter. A peak flow meter measures how well the lungs are working and the severity of your asthma. It helps you monitor your condition. ?Follow these instructions at home: ?Controlling your home environment ?Control your home environment in the following ways to help avoid triggers and prevent asthma attacks: ?Change your heating and air conditioning filter regularly. ?Limit your use of fireplaces and wood stoves. ?Get rid of pests (such as roaches and mice) and their droppings. ?Throw away plants if you see mold on them. ?Clean floors and dust surfaces regularly. Use unscented cleaning products. ?Try to have someone else vacuum for you regularly. Stay out of rooms while they are being vacuumed and for a short while afterward. If you vacuum, use a dust mask from a hardware store, a double-layered or microfilter vacuum cleaner bag, or a vacuum cleaner with a HEPA filter. ?Replace carpet with wood, tile, or vinyl flooring. Carpet can trap dander and dust. ?Use allergy-proof pillows, mattress covers, and box spring covers. ?Keep your bedroom a trigger-free room. ?Avoid pets and keep windows closed when allergens are in the air. ?Wash beddings every week in hot water and dry them in a dryer. ?Use blankets that are made of polyester or cotton. ?Clean bathrooms and kitchens with bleach. If possible, have someone repaint the walls in these rooms with mold-resistant paint. Stay out of the rooms that are being cleaned and painted. ?Wash your hands often with soap and water. If soap and water are not available, use hand sanitizer. ?Do not allow anyone to smoke in your home. ?General instructions ?Take over-the-counter and prescription medicines only as told by your health care provider. ?Speak with your health care provider if you have questions about how or when to take the medicines. ?Make note if you are  requiring more frequent dosages. ?Do not use any products that contain nicotine or tobacco, such as cigarettes and e-cigarettes. If you need help quitting, ask your health care provider. Also, avoid being exposed to secondhand smoke. ?Use a peak flow meter as told by your health care provider. Record and keep track of the readings. ?Understand and use the asthma action plan to help minimize, or stop an asthma attack, without needing to seek medical care. ?Make sure you stay up to date on your yearly vaccinations as told by your health care provider. This may include vaccines for the flu and pneumonia. ?Avoid outdoor activities when allergen counts are high and when air quality is low. ?Wear a ski mask that covers your nose and mouth during outdoor winter activities. Exercise indoors on cold days if you can. ?Warm up before exercising, and take time for a cool-down period after exercise. ?Keep all follow-up visits as told by your health care provider. This is important. ?Where to find more information ?For information about asthma, turn to the Centers for Disease Control and Prevention at http://www.clark.net/ ?For air quality information, turn to [employer] at https://www.miller-reyes.info/ ?Contact a health care provider if: ?You have wheezing, shortness of breath, or a cough even while you are taking medicine to prevent attacks. ?The mucus you cough up (sputum) is thicker than usual. ?Your sputum changes from clear or white to yellow, green, gray, or bloody. ?Your medicines are causing side effects, such as a rash, itching, swelling, or trouble breathing. ?You need to use a reliever medicine more than 2-3 times a week. ?Your peak flow reading is still at 50-79%  of your personal best after following your action plan for 1 hour. ?You have a fever. ?Get help right away if: ?You are getting worse and do not respond to treatment during an asthma attack. ?You are short of breath when at rest or when doing very little physical activity. ?You  have difficulty eating, drinking, or talking. ?You have chest pain or tightness. ?You develop a fast heartbeat or palpitations. ?You have a bluish color to your lips or fingernails. ?You are light-headed

## 2021-10-14 NOTE — Progress Notes (Signed)
Erroneous encounter

## 2021-11-17 NOTE — Progress Notes (Signed)
Erroneous encounter

## 2021-11-21 ENCOUNTER — Encounter: Payer: BC Managed Care – PPO | Admitting: Family

## 2022-02-11 ENCOUNTER — Encounter (HOSPITAL_COMMUNITY): Payer: Self-pay | Admitting: Family Medicine

## 2022-02-11 ENCOUNTER — Inpatient Hospital Stay (HOSPITAL_COMMUNITY): Payer: BC Managed Care – PPO

## 2022-02-11 ENCOUNTER — Observation Stay (HOSPITAL_COMMUNITY)
Admission: AD | Admit: 2022-02-11 | Discharge: 2022-02-12 | Disposition: A | Discharge status: Home / Self Care | Payer: BC Managed Care – PPO | Source: Other Acute Inpatient Hospital | Attending: Internal Medicine | Admitting: Internal Medicine

## 2022-02-11 DIAGNOSIS — R079 Chest pain, unspecified: Secondary | ICD-10-CM | POA: Diagnosis present

## 2022-02-11 DIAGNOSIS — Z79899 Other long term (current) drug therapy: Secondary | ICD-10-CM | POA: Insufficient documentation

## 2022-02-11 DIAGNOSIS — F1210 Cannabis abuse, uncomplicated: Secondary | ICD-10-CM | POA: Insufficient documentation

## 2022-02-11 DIAGNOSIS — Z91040 Latex allergy status: Secondary | ICD-10-CM | POA: Diagnosis not present

## 2022-02-11 DIAGNOSIS — J45909 Unspecified asthma, uncomplicated: Secondary | ICD-10-CM | POA: Insufficient documentation

## 2022-02-11 DIAGNOSIS — J982 Interstitial emphysema: Secondary | ICD-10-CM | POA: Diagnosis not present

## 2022-02-11 DIAGNOSIS — F17200 Nicotine dependence, unspecified, uncomplicated: Secondary | ICD-10-CM | POA: Diagnosis present

## 2022-02-11 DIAGNOSIS — J4520 Mild intermittent asthma, uncomplicated: Secondary | ICD-10-CM | POA: Diagnosis present

## 2022-02-11 HISTORY — DX: Interstitial emphysema: J98.2

## 2022-02-11 HISTORY — DX: Unspecified asthma, uncomplicated: J45.909

## 2022-02-11 LAB — RAPID URINE DRUG SCREEN, HOSP PERFORMED
Amphetamines: NOT DETECTED
Barbiturates: NOT DETECTED
Benzodiazepines: POSITIVE — AB
Cocaine: NOT DETECTED
Opiates: POSITIVE — AB
Tetrahydrocannabinol: POSITIVE — AB

## 2022-02-11 MED ORDER — ALBUTEROL SULFATE (2.5 MG/3ML) 0.083% IN NEBU: 2.5000 mg | INHALATION_SOLUTION | Freq: Four times a day (QID) | RESPIRATORY_TRACT | Status: DC | PRN

## 2022-02-11 MED ORDER — DOCUSATE SODIUM 100 MG PO CAPS
100.0000 mg | ORAL_CAPSULE | Freq: Two times a day (BID) | ORAL | Status: DC
Start: 2022-02-11 — End: 2022-02-12
  Administered 2022-02-11 – 2022-02-12 (×2): 100 mg via ORAL
  Filled 2022-02-11 (×2): qty 1

## 2022-02-11 MED ORDER — ONDANSETRON HCL 4 MG PO TABS: 4.0000 mg | ORAL_TABLET | Freq: Four times a day (QID) | ORAL | Status: DC | PRN

## 2022-02-11 MED ORDER — LACTATED RINGERS IV SOLN
INTRAVENOUS | Status: DC
Start: 2022-02-11 — End: 2022-02-12

## 2022-02-11 MED ORDER — NICOTINE 14 MG/24HR TD PT24
14.0000 mg | MEDICATED_PATCH | Freq: Every day | TRANSDERMAL | Status: DC
  Administered 2022-02-11: 14 mg via TRANSDERMAL
  Filled 2022-02-11: qty 1

## 2022-02-11 MED ORDER — SODIUM CHLORIDE 0.9% FLUSH
3.0000 mL | Freq: Two times a day (BID) | INTRAVENOUS | Status: DC
  Administered 2022-02-11: 3 mL via INTRAVENOUS

## 2022-02-11 MED ORDER — ALBUTEROL SULFATE HFA 108 (90 BASE) MCG/ACT IN AERS
2.0000 | INHALATION_SPRAY | Freq: Four times a day (QID) | RESPIRATORY_TRACT | Status: DC | PRN
Start: 2022-02-11 — End: 2022-02-11

## 2022-02-11 MED ORDER — ACETAMINOPHEN 650 MG RE SUPP: 650.0000 mg | Freq: Four times a day (QID) | RECTAL | Status: DC | PRN

## 2022-02-11 MED ORDER — POLYETHYLENE GLYCOL 3350 17 G PO PACK
17.0000 g | PACK | Freq: Every day | ORAL | Status: DC | PRN
Start: 2022-02-11 — End: 2022-02-12

## 2022-02-11 MED ORDER — LORATADINE 10 MG PO TABS
10.0000 mg | ORAL_TABLET | Freq: Every day | ORAL | Status: DC
  Administered 2022-02-12: 10 mg via ORAL
  Filled 2022-02-11 (×2): qty 1

## 2022-02-11 MED ORDER — OXYCODONE HCL 5 MG PO TABS
5.0000 mg | ORAL_TABLET | ORAL | Status: DC | PRN
  Administered 2022-02-11 – 2022-02-12 (×4): 5 mg via ORAL
  Filled 2022-02-11 (×4): qty 1

## 2022-02-11 MED ORDER — BISACODYL 5 MG PO TBEC: 5.0000 mg | DELAYED_RELEASE_TABLET | Freq: Every day | ORAL | Status: DC | PRN

## 2022-02-11 MED ORDER — HYDRALAZINE HCL 20 MG/ML IJ SOLN: 5.0000 mg | INTRAMUSCULAR | Status: DC | PRN

## 2022-02-11 MED ORDER — ONDANSETRON HCL 4 MG/2ML IJ SOLN: 4.0000 mg | Freq: Four times a day (QID) | INTRAMUSCULAR | Status: DC | PRN

## 2022-02-11 MED ORDER — NICOTINE 21 MG/24HR TD PT24
21.0000 mg | MEDICATED_PATCH | Freq: Every day | TRANSDERMAL | Status: DC
  Administered 2022-02-11 – 2022-02-12 (×2): 21 mg via TRANSDERMAL
  Filled 2022-02-11 (×3): qty 1

## 2022-02-11 MED ORDER — ACETAMINOPHEN 325 MG PO TABS: 650.0000 mg | ORAL_TABLET | Freq: Four times a day (QID) | ORAL | Status: DC | PRN

## 2022-02-11 MED ORDER — KETOROLAC TROMETHAMINE 30 MG/ML IJ SOLN
30.0000 mg | Freq: Four times a day (QID) | INTRAMUSCULAR | Status: DC | PRN
  Administered 2022-02-11 – 2022-02-12 (×3): 30 mg via INTRAVENOUS
  Filled 2022-02-11 (×3): qty 1

## 2022-02-11 NOTE — Consult Note (Signed)
Reason for Consult:Pneumomediastinum Referring Physician: [facility]- Dr. Demetra Shiner Weida is an 26 y.o. male.  HPI: 26 yo man with a history of asthma, smoking, vaping and pneumomediastinum in 2017 presented with a cc/o CP and numbness in left fingers.  Pain occurred while at work in [employer]. Sudden onset and severe. Noted pain in neck, voice change and discomfort with swallowing as well.  He does relate a small amount of emesis after drinking some Gatorade, but not forceful. Does smoke tobacco and marijuana and also vapes.  Currently pain about 6/10.  Past Medical History:  Diagnosis Date   Hypokalemia    history of low potassium per pt    Past Surgical History:  Procedure Laterality Date   ESOPHAGOGASTRODUODENOSCOPY     per pt report    Family History  Problem Relation Age of Onset   Healthy Mother     Social History:  reports that he has been smoking e-cigarettes and cigarettes. He has never used smokeless tobacco. He reports that he does not drink alcohol and does not use drugs.  Allergies:  Allergies  Allergen Reactions   Latex Rash   Ms Contin [Morphine] Rash    Medications: No orders yet  Home meds: Albuterol, Zyrtec  CXR reviewed, it shows pneumomediastinum  Review of Systems  Constitutional:  Negative for activity change and unexpected weight change.  HENT:  Positive for trouble swallowing (since chest pain, not prior) and voice change.   Respiratory:  Positive for shortness of breath and wheezing.   Cardiovascular:  Positive for chest pain.  Gastrointestinal:  Positive for vomiting (1 episode).  All other systems reviewed and are negative.  Blood pressure 124/78, pulse 72, temperature 98.3 F (36.8 C), temperature source Oral, resp. rate 14, height 5\' 8"  (1.727 m), weight 66.8 kg, SpO2 100 %. Physical Exam Vitals reviewed.  Constitutional:      Appearance: Normal appearance.  HENT:     Mouth/Throat:     Comments: Hoarse voice Neck:      Comments: Supple, + subcutaneous emphysema, trachea midline Cardiovascular:     Rate and Rhythm: Normal rate and regular rhythm.  Pulmonary:     Effort: Pulmonary effort is normal. No respiratory distress.     Breath sounds: Normal breath sounds. No wheezing or rales.  Abdominal:     Palpations: Abdomen is soft.     Tenderness: There is no abdominal tenderness.  Skin:    General: Skin is warm and dry.  Neurological:     General: No focal deficit present.     Mental Status: He is alert and oriented to person, place, and time.     Cranial Nerves: No cranial nerve deficit.     Motor: No weakness.     Assessment/Plan: 26 yo male with history of asthma, pneumomediastinum and inhalation of substances presents with chest and neck pain and is found to have a recurrent pneumomediastinum.  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  Treatment is conservative with pain control and observation.  Emphasized the importance of stopping inhalation of all substances.  No history or physical findings to suggest esophageal injury.  I cannot review images from CT done at outside hospital, but by report nothing there to suggest it either.    25 02/11/2022, 11:05 AM

## 2022-02-11 NOTE — H&P (Addendum)
History and Physical    Patient: Joseph Dunn:[id number] DOB: 12-19-1995 DOA: 02/11/2022 DOS: the patient was seen and examined on 02/11/2022 PCP: Pcp, No  Patient coming from: Home - lives with mother, roommate, girlfriend; NOK: Mother, [phone]   Chief Complaint: neck/chest pain  HPI: [name] is a 26 y.o. male with medical history significant of asthma and pneumomediastinum presenting with neck/chest pain.  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  He got down and felt some tension in his chest.  He went back up and finished the job about 130.  He went in to do sheet rock and felt a massive pain in his chest with headache.  He had similar issue in 2017.  He has not further issues in the interim.  His voice is hoarse and nasally.  The pain is ongoing.  He is "allergic to morphine" and they were giving him Dilaudid and he was able to sleep and then he woke up with severe pain again.  He wasn't allowed to eat/drink and he was given more Dilaudid with some improvement to where it was bearable.  He is having sharp pain in his neck and chest.  SOB only with deep inspiration.  Coughing is very painful and he is trying to hold it in.    ER Course:  [facility] to [facility] transfer, per Dr. Antionette Char:  Asthma, vapes and smokes, was admitted to [facility] in 2017 with pneumomediastinum, and now presents wtih 2 days of pain in neck and chest. He had CT demonstrating extensive pneumomediastinum without pneumothorax and with no esophageal, tracheal, or bronchial defect identified. He denied recent vomiting or coughing and is stable on rm air.      Review of Systems: As mentioned in the history of present illness. All other systems reviewed and are negative. Past Medical History:  Diagnosis Date   Asthma    mostly in childhood   Hypokalemia    history of low potassium per pt   Pneumomediastinum [facility])    Past Surgical History:  Procedure Laterality Date    ESOPHAGOGASTRODUODENOSCOPY     per pt report   Social History:  reports that he has been smoking e-cigarettes and cigarettes. He has never used smokeless tobacco. He reports current alcohol use. He reports current drug use. Drug: Marijuana.  Allergies  Allergen Reactions   Latex Rash   Ms Contin [Morphine] Rash    Family History  Problem Relation Age of Onset   Healthy Mother     Prior to Admission medications   Medication Sig Start Date End Date Taking? Authorizing Provider  albuterol (VENTOLIN HFA) 108 (90 Base) MCG/ACT inhaler Inhale 2 puffs into the lungs every 6 (six) hours as needed for wheezing or shortness of breath. 10/12/21   Mayers, Cari S, PA-C  azithromycin (ZITHROMAX) 250 MG tablet Take 2 tabs PO day 1, then take 1 tab PO once daily 10/12/21   Mayers, Cari S, PA-C  benzonatate (TESSALON) 200 MG capsule Take 1 capsule (200 mg total) by mouth 2 (two) times daily as needed for cough. 10/12/21   Mayers, Cari S, PA-C  cetirizine (ZYRTEC) 10 MG tablet Take 1 tablet (10 mg total) by mouth daily. 10/12/21   Mayers, Cari S, PA-C  pantoprazole (PROTONIX) 40 MG tablet Take 1 tablet (40 mg total) by mouth daily. 04/13/16 11/14/20  Richarda Overlie, MD    Physical Exam: Vitals:   02/11/22 0927 02/11/22 1311  BP: 124/78 117/71  Pulse: 72 60  Resp: 14 17  Temp: 98.3 F (36.8 C) 98 F (36.7 C)  TempSrc: Oral Axillary  SpO2: 100% 98%  Weight: 66.8 kg   Height: 5\' 8"  (1.727 m)    General:  Appears calm and comfortable and is in NAD Eyes:   EOMI, normal lids, iris ENT:  grossly normal hearing, lips & tongue, mmm Neck:  no LAD, masses or thyromegaly; mild crepitus along the base of his neck B Cardiovascular:  RRR, no m/r/g. No LE edema.  Respiratory:   CTA bilaterally with no wheezes/rales/rhonchi.  Normal respiratory effort. Abdomen:  soft, NT, ND Skin:  no rash or induration seen on limited exam Musculoskeletal:  grossly normal tone BUE/BLE, good ROM, no bony  abnormality Psychiatric:  grossly normal mood and affect, speech fluent and appropriate, AOx3 Neurologic:  CN 2-12 grossly intact, moves all extremities in coordinated fashion   Radiological Exams on Admission: Independently reviewed - see discussion in A/P where applicable  Chest CT - Extensive pneumomediastinum which extends superiorly into the neck beyond the field-of-view, to the soft tissues of the right shoulder, and along the proximal bronchi bilaterally. No definitive defect identified in the esophagus, trachea, or bronchi as the origin of this gas.   EKG: [facility] EKG is pending.  Read at [facility]: Sinus tachycardia; anterior infarct, age undetermined   Labs on Admission: I have personally reviewed the available labs and imaging studies at the time of the admission.  Pertinent labs:    K+ 3.2 WBC 13.2    Assessment and Plan: Principal Problem:   Pneumomediastinum (HCC) Active Problems:   Asthma in adult, mild intermittent, uncomplicated   Tobacco dependence   Marijuana abuse   Pneumomediastinum -Patient with prior h/o the same presenting with spontaneous pneumomediastinum, likely from ruptured bleb -Could be associated with asthma, although he reports good control with few exacerbations without need for medication -CT surgery consulted and recommends 24 hour observation -Will observe the patient overnight on Med Tele -It appears to be uncomplicated and he does not appear to need antibiotics at this time -He may benefit from high-flow O2; will add prn  -Treatment is primarily supportive with analgesias, rest, and avoidance of maneuvers that increase pulmonary pressure -While he prefers Dilaudid for pain, will instead give PO oxy and IV Toradol as needed -CXR ordered in case comparison film is needed for tomorrow -Clear liquids for now - since Dr. [facility] was not able to review the films themselves (only could see the report in Care Everywhere), there does not appear to  be an esophageal disruption but this is not completely ruled out -If he is improving, he is likely ok for dc to home tomorrow  Asthma -Continue prn Albuterol -Smoking cessation strongly encouraged  Tobacco dependence -Encourage absolute cessation of cigarettes and electronic cigarettes.   -This was discussed with the patient and should be reviewed on an ongoing basis.   -Patch ordered at patient request.   Marijuana abuse -Cessation encouraged; this should be encouraged on an ongoing basis -UDS ordered     Advance Care Planning:   Code Status: Full Code   Consults: CT surgery  DVT Prophylaxis: SCDs  Family Communication: Girlfriend was present but asleep throughout the encounter  Severity of Illness: The appropriate patient status for this patient is OBSERVATION. Observation status is judged to be reasonable and necessary in order to provide the required intensity of service to ensure the patient's safety. The patient's presenting symptoms, physical exam findings, and initial radiographic and laboratory data in  the context of their medical condition is felt to place them at decreased risk for further clinical deterioration. Furthermore, it is anticipated that the patient will be medically stable for discharge from the hospital within 2 midnights of admission.   Author: Jonah Blue, MD 02/11/2022 4:20 PM  For on call review [email or web address].

## 2022-02-11 NOTE — Progress Notes (Signed)
Pt arrived to 4e via carelink. Pt oriented to room and staf. CHG bath given. Vitals obtained. No orders at this time. Admitting MD assigned to pt.

## 2022-02-12 DIAGNOSIS — F17200 Nicotine dependence, unspecified, uncomplicated: Secondary | ICD-10-CM

## 2022-02-12 DIAGNOSIS — J982 Interstitial emphysema: Secondary | ICD-10-CM | POA: Diagnosis not present

## 2022-02-12 LAB — BASIC METABOLIC PANEL
Anion gap: 5 (ref 5–15)
BUN: 7 mg/dL (ref 6–20)
CO2: 27 mmol/L (ref 22–32)
Calcium: 8.9 mg/dL (ref 8.9–10.3)
Chloride: 109 mmol/L (ref 98–111)
Creatinine, Ser: 0.87 mg/dL (ref 0.61–1.24)
GFR, Estimated: >60 mL/min (ref >60)
Glucose, Bld: 94 mg/dL (ref 70–99)
Potassium: 3.6 mmol/L (ref 3.5–5.1)
Sodium: 141 mmol/L (ref 135–145)

## 2022-02-12 LAB — CBC
HCT: 38.5 % — ABNORMAL LOW (ref 39.0–52.0)
Hemoglobin: 13.3 g/dL (ref 13.0–17.0)
MCH: 30.8 pg (ref 26.0–34.0)
MCHC: 34.5 g/dL (ref 30.0–36.0)
MCV: 89.1 fL (ref 80.0–100.0)
Platelets: 251 10*3/uL (ref 150–400)
RBC: 4.32 MIL/uL (ref 4.22–5.81)
RDW: 12.7 % (ref 11.5–15.5)
WBC: 6.6 10*3/uL (ref 4.0–10.5)
nRBC: 0 % (ref 0.0–0.2)

## 2022-02-12 MED ORDER — OXYCODONE HCL 5 MG PO TABS: 5.0000 mg | ORAL_TABLET | ORAL | 0 refills | Status: AC | PRN

## 2022-02-12 MED ORDER — DOCUSATE SODIUM 100 MG PO CAPS: 100.0000 mg | ORAL_CAPSULE | Freq: Two times a day (BID) | ORAL | 0 refills | Status: AC

## 2022-02-12 MED ORDER — ACETAMINOPHEN 325 MG PO TABS: 650.0000 mg | ORAL_TABLET | Freq: Four times a day (QID) | ORAL | Status: AC | PRN

## 2022-02-12 MED ORDER — NICOTINE 21 MG/24HR TD PT24: 21.0000 mg | MEDICATED_PATCH | Freq: Every day | TRANSDERMAL | 0 refills | Status: AC

## 2022-02-12 NOTE — Discharge Instructions (Signed)
Follow with Primary MD in 7 days   Get CBC, CMP, 2 view Chest X ray checked  by Primary MD next visit.    Activity: As tolerated with Full fall precautions use walker/cane & assistance as needed   Disposition Home    Diet: Regular diet   On your next visit with your primary care physician please Get Medicines reviewed and adjusted.   Please request your Prim.MD to go over all Hospital Tests and Procedure/Radiological results at the follow up, please get all Hospital records sent to your Prim MD by signing hospital release before you go home.   If you experience worsening of your admission symptoms, develop shortness of breath, life threatening emergency, suicidal or homicidal thoughts you must seek medical attention immediately by calling 911 or calling your MD immediately  if symptoms less severe.  You Must read complete instructions/literature along with all the possible adverse reactions/side effects for all the Medicines you take and that have been prescribed to you. Take any new Medicines after you have completely understood and accpet all the possible adverse reactions/side effects.   Do not drive, operating heavy machinery, perform activities at heights, swimming or participation in water activities or provide baby sitting services if your were admitted for syncope or siezures until you have seen by Primary MD or a Neurologist and advised to do so again.  Do not drive when taking Pain medications.    Do not take more than prescribed Pain, Sleep and Anxiety Medications  Special Instructions: If you have smoked or chewed Tobacco  in the last 2 yrs please stop smoking, stop any regular Alcohol  and or any Recreational drug use.  Wear Seat belts while driving.   Please note  You were cared for by a hospitalist during your hospital stay. If you have any questions about your discharge medications or the care you received while you were in the hospital after you are discharged,  you can call the unit and asked to speak with the hospitalist on call if the hospitalist that took care of you is not available. Once you are discharged, your primary care physician will handle any further medical issues. Please note that NO REFILLS for any discharge medications will be authorized once you are discharged, as it is imperative that you return to your primary care physician (or establish a relationship with a primary care physician if you do not have one) for your aftercare needs so that they can reassess your need for medications and monitor your lab values.  

## 2022-02-12 NOTE — Discharge Summary (Signed)
Physician Discharge Summary  [facility] XTA:[id number] DOB: 12-07-1995 DOA: 02/11/2022  PCP: Pcp, No  Admit date: 02/11/2022 Discharge date: 02/12/2022  Admitted From: Home Disposition:  Home   Recommendations for Outpatient Follow-up:  Follow up with PCP in 1-2 weeks Repeat chest x-ray during next visit Continue counseling about tobacco and vape use   Discharge Condition:Stable CODE STATUS:FULL Diet recommendation:  Regular   Brief/Interim Summary:  Joseph Dunn is a 26 y.o. male with medical history significant of asthma and pneumomediastinum presenting with neck/chest pain.  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  He got down and felt some tension in his chest.  He went back up and finished the job about 130.  He went in to do sheet rock and felt a massive pain in his chest with headache.  He had similar issue in 2017.  He has not further issues in the interim.  His voice is hoarse and nasally.  The pain is ongoing.  He is "allergic to morphine" and they were giving him Dilaudid and he was able to sleep and then he woke up with severe pain again.  He wasn't allowed to eat/drink and he was given more Dilaudid with some improvement to where it was bearable.  He is having sharp pain in his neck and chest.  SOB only with deep inspiration.  Coughing is very painful and he is trying to hold it in.   Asthma, vapes and smokes, was admitted to [facility] in 2017 with pneumomediastinum, and now presents wtih 2 days of pain in neck and chest. He had CT demonstrating extensive pneumomediastinum without pneumothorax and with no esophageal, tracheal, or bronchial defect identified. He denied recent vomiting or coughing and is stable on rm air.    Pneumomediastinum -Patient with prior h/o the same presenting with spontaneous pneumomediastinum, likely from ruptured bleb -CT surgery consulted and recommends 24 hour observation -Patient was seen by CT surgery this morning, subcu  emphysema in the neck and chest area has significantly subsided, hoarse voice has resolved, patient will be discharged home today -He was counseled at length about tobacco and vape use cessation -Pain has improved, he will be discharged on oxycodone total of 12 tablets over the next 4 days.   Asthma -Continue prn Albuterol -Smoking cessation strongly encouraged   Tobacco dependence -Encourage absolute cessation of cigarettes and electronic cigarettes.   -This was discussed with the patient    Marijuana abuse -Cessation encouraged; this should be encouraged on an ongoing basis     Discharge Diagnoses:  Principal Problem:   Pneumomediastinum (HCC) Active Problems:   Asthma in adult, mild intermittent, uncomplicated   Tobacco dependence   Marijuana abuse    Discharge Instructions  Discharge Instructions     Diet - low sodium heart healthy   Complete by: As directed    Discharge instructions   Complete by: As directed    Follow with Primary MDin 7 days   Get CBC, CMP, 2 view Chest X ray checked  by Primary MD next visit.    Activity: As tolerated with Full fall precautions use walker/cane & assistance as needed   Disposition Home    Diet: Regular diet  On your next visit with your primary care physician please Get Medicines reviewed and adjusted.   Please request your Prim.MD to go over all Hospital Tests and Procedure/Radiological results at the follow up, please get all Hospital records sent to your Prim MD by signing hospital release  before you go home.   If you experience worsening of your admission symptoms, develop shortness of breath, life threatening emergency, suicidal or homicidal thoughts you must seek medical attention immediately by calling 911 or calling your MD immediately  if symptoms less severe.  You Must read complete instructions/literature along with all the possible adverse reactions/side effects for all the Medicines you take and that have  been prescribed to you. Take any new Medicines after you have completely understood and accpet all the possible adverse reactions/side effects.   Do not drive, operating heavy machinery, perform activities at heights, swimming or participation in water activities or provide baby sitting services if your were admitted for syncope or siezures until you have seen by Primary MD or a Neurologist and advised to do so again.  Do not drive when taking Pain medications.    Do not take more than prescribed Pain, Sleep and Anxiety Medications  Special Instructions: If you have smoked or chewed Tobacco  in the last 2 yrs please stop smoking, stop any regular Alcohol  and or any Recreational drug use.  Wear Seat belts while driving.   Please note  You were cared for by a hospitalist during your hospital stay. If you have any questions about your discharge medications or the care you received while you were in the hospital after you are discharged, you can call the unit and asked to speak with the hospitalist on call if the hospitalist that took care of you is not available. Once you are discharged, your primary care physician will handle any further medical issues. Please note that NO REFILLS for any discharge medications will be authorized once you are discharged, as it is imperative that you return to your primary care physician (or establish a relationship with a primary care physician if you do not have one) for your aftercare needs so that they can reassess your need for medications and monitor your lab values.   Increase activity slowly   Complete by: As directed       Allergies as of 02/12/2022       Reactions   Latex Rash   Ms Contin [morphine] Rash        Medication List     TAKE these medications    acetaminophen 325 MG tablet Commonly known as: TYLENOL Take 2 tablets (650 mg total) by mouth every 6 (six) hours as needed for mild pain (or Fever >/= 101). What changed:  medication  strength how much to take when to take this reasons to take this   albuterol 108 (90 Base) MCG/ACT inhaler Commonly known as: VENTOLIN HFA Inhale 2 puffs into the lungs every 6 (six) hours as needed for wheezing or shortness of breath.   cetirizine 10 MG tablet Commonly known as: ZYRTEC Take 1 tablet (10 mg total) by mouth daily.   docusate sodium 100 MG capsule Commonly known as: COLACE Take 1 capsule (100 mg total) by mouth 2 (two) times daily.   nicotine 21 mg/24hr patch Commonly known as: NICODERM CQ - dosed in mg/24 hours Place 1 patch (21 mg total) onto the skin daily. Start taking on: February 13, 2022   oxyCODONE 5 MG immediate release tablet Commonly known as: Oxy IR/ROXICODONE Take 1 tablet (5 mg total) by mouth every 4 (four) hours as needed for moderate pain.   VITAMIN C PO Take 1 tablet by mouth daily.        Allergies  Allergen Reactions   Latex Rash  Ms Contin [Morphine] Rash    Consultations: CT surgery   Procedures/Studies: DG Chest 2 View  Result Date: 02/11/2022 CLINICAL DATA:  Patient admitted for pneumomediastinum. EXAM: CHEST - 2 VIEW COMPARISON:  April 08, 2016 FINDINGS: Pneumomediastinum is identified, extending into the soft tissues of the base of the neck bilaterally. The findings are similar to the April 08, 2016 study. No pneumothorax. The heart, hila, mediastinum, lungs, and pleura are unremarkable. No other acute abnormalities are identified. IMPRESSION: Pneumomediastinum, consistent with reported history. Electronically Signed   By: Gerome Sam III M.D.   On: 02/11/2022 12:23      Subjective:  Chest pain has significantly improved, no dyspnea.he denies any pain with eating regular food at lunch  Discharge Exam: Vitals:   02/12/22 0329 02/12/22 0819  BP: 111/68 131/76  Pulse: 65 77  Resp: 19 18  Temp: 97.8 F (36.6 C) 97.9 F (36.6 C)  SpO2: 98% 99%   Vitals:   02/11/22 2023 02/11/22 2340 02/12/22 0329 02/12/22  0819  BP: 121/80 113/61 111/68 131/76  Pulse: 90 70 65 77  Resp: 20 18 19 18   Temp: 98.2 F (36.8 C) 98 F (36.7 C) 97.8 F (36.6 C) 97.9 F (36.6 C)  TempSrc: Oral Oral Oral Oral  SpO2: 100% 99% 98% 99%  Weight:      Height:        General: Pt is alert, awake, not in acute distress, minimal subcu air in the neck, voice is appropriate, Cardiovascular: RRR, S1/S2 +, no rubs, no gallops Respiratory: CTA bilaterally, no wheezing, no rhonchi Abdominal: Soft, NT, ND, bowel sounds + Extremities: no edema, no cyanosis    The results of significant diagnostics from this hospitalization (including imaging, microbiology, ancillary and laboratory) are listed below for reference.     Microbiology: No results found for this or any previous visit (from the past 240 hour(s)).   Labs: BNP (last 3 results) No results for input(s): "BNP" in the last 8760 hours. Basic Metabolic Panel: Recent Labs  Lab 02/12/22 0356  NA 141  K 3.6  CL 109  CO2 27  GLUCOSE 94  BUN 7  CREATININE 0.87  CALCIUM 8.9   Liver Function Tests: No results for input(s): "AST", "ALT", "ALKPHOS", "BILITOT", "PROT", "ALBUMIN" in the last 168 hours. No results for input(s): "LIPASE", "AMYLASE" in the last 168 hours. No results for input(s): "AMMONIA" in the last 168 hours. CBC: Recent Labs  Lab 02/12/22 0356  WBC 6.6  HGB 13.3  HCT 38.5*  MCV 89.1  PLT 251   Cardiac Enzymes: No results for input(s): "CKTOTAL", "CKMB", "CKMBINDEX", "TROPONINI" in the last 168 hours. BNP: Invalid input(s): "POCBNP" CBG: No results for input(s): "GLUCAP" in the last 168 hours. D-Dimer No results for input(s): "DDIMER" in the last 72 hours. Hgb A1c No results for input(s): "HGBA1C" in the last 72 hours. Lipid Profile No results for input(s): "CHOL", "HDL", "LDLCALC", "TRIG", "CHOLHDL", "LDLDIRECT" in the last 72 hours. Thyroid function studies No results for input(s): "TSH", "T4TOTAL", "T3FREE", "THYROIDAB" in the  last 72 hours.  Invalid input(s): "FREET3" Anemia work up No results for input(s): "VITAMINB12", "FOLATE", "FERRITIN", "TIBC", "IRON", "RETICCTPCT" in the last 72 hours. Urinalysis No results found for: "COLORURINE", "APPEARANCEUR", "LABSPEC", "PHURINE", "GLUCOSEU", "HGBUR", "BILIRUBINUR", "KETONESUR", "PROTEINUR", "UROBILINOGEN", "NITRITE", "LEUKOCYTESUR" Sepsis Labs Recent Labs  Lab 02/12/22 0356  WBC 6.6   Microbiology No results found for this or any previous visit (from the past 240 hour(s)).   Time coordinating discharge: Over 30  minutes  SIGNED:   Huey Bienenstock, MD  [facility] 02/12/2022, 2:32 PM Pager   If 7PM-7AM, please contact night-coverage [email or web address] Password [email or web address]

## 2022-02-12 NOTE — Progress Notes (Signed)
  Subjective: Still some pain especially when he first woke up Voice better, hungry asking for regular food  Objective: Vital signs in last 24 hours: Temp:  [97.8 F (36.6 C)-98.5 F (36.9 C)] 97.9 F (36.6 C) (07/30 0819) Pulse Rate:  [60-90] 77 (07/30 0819) Cardiac Rhythm: Sinus tachycardia (07/29 2023) Resp:  [12-20] 18 (07/30 0819) BP: (111-131)/(61-80) 131/76 (07/30 0819) SpO2:  [98 %-100 %] 99 % (07/30 0819)  Hemodynamic parameters for last 24 hours:    Intake/Output from previous day: 07/29 0701 - 07/30 0700 In: 1078.2 [P.O.:120; I.V.:958.2] Out: 0  Intake/Output this shift: No intake/output data recorded.  General appearance: alert, cooperative, and no distress Neurologic: intact Heart: regular rate and rhythm Lungs: clear to auscultation bilaterally Minimal SQ air in neck, voice less hoarse  Lab Results: Recent Labs    02/12/22 0356  WBC 6.6  HGB 13.3  HCT 38.5*  PLT 251   BMET:  Recent Labs    02/12/22 0356  NA 141  K 3.6  CL 109  CO2 27  GLUCOSE 94  BUN 7  CREATININE 0.87  CALCIUM 8.9    PT/INR: No results for input(s): "LABPROT", "INR" in the last 72 hours. ABG No results found for: "PHART", "HCO3", "TCO2", "ACIDBASEDEF", "O2SAT" CBG (last 3)  No results for input(s): "GLUCAP" in the last 72 hours.  Assessment/Plan: S/P  HD # 2 He feels better today, afebrile with normal WBC Will advance to regular diet Stable for dc from my standpoint if able to tolerate diet    LOS: 1 day    Loreli Slot 02/12/2022

## 2022-07-04 ENCOUNTER — Emergency Department (HOSPITAL_COMMUNITY)
Admission: EM | Admit: 2022-07-04 | Discharge: 2022-07-04 | Discharge status: Against Medical Advice | Payer: BC Managed Care – PPO | Attending: Emergency Medicine | Admitting: Emergency Medicine

## 2022-07-04 ENCOUNTER — Other Ambulatory Visit: Payer: Self-pay

## 2022-07-04 ENCOUNTER — Emergency Department (HOSPITAL_COMMUNITY): Payer: BC Managed Care – PPO

## 2022-07-04 DIAGNOSIS — Z5321 Procedure and treatment not carried out due to patient leaving prior to being seen by health care provider: Secondary | ICD-10-CM | POA: Diagnosis not present

## 2022-07-04 DIAGNOSIS — M545 Low back pain, unspecified: Secondary | ICD-10-CM | POA: Insufficient documentation

## 2022-07-04 LAB — CBC WITH DIFFERENTIAL/PLATELET
Abs Immature Granulocytes: 0.03 10*3/uL (ref 0.00–0.07)
Basophils Absolute: 0.1 10*3/uL (ref 0.0–0.1)
Basophils Relative: 1 %
Eosinophils Absolute: 0 10*3/uL (ref 0.0–0.5)
Eosinophils Relative: 1 %
HCT: 44.1 % (ref 39.0–52.0)
Hemoglobin: 15.3 g/dL (ref 13.0–17.0)
Immature Granulocytes: 0 %
Lymphocytes Relative: 4 %
Lymphs Abs: 0.4 10*3/uL — ABNORMAL LOW (ref 0.7–4.0)
MCH: 30.7 pg (ref 26.0–34.0)
MCHC: 34.7 g/dL (ref 30.0–36.0)
MCV: 88.6 fL (ref 80.0–100.0)
Monocytes Absolute: 1.6 10*3/uL — ABNORMAL HIGH (ref 0.1–1.0)
Monocytes Relative: 19 %
Neutro Abs: 6.4 10*3/uL (ref 1.7–7.7)
Neutrophils Relative %: 75 %
Platelets: 318 10*3/uL (ref 150–400)
RBC: 4.98 MIL/uL (ref 4.22–5.81)
RDW: 12.8 % (ref 11.5–15.5)
WBC: 8.5 10*3/uL (ref 4.0–10.5)
nRBC: 0 % (ref 0.0–0.2)

## 2022-07-04 LAB — BASIC METABOLIC PANEL WITH GFR
Anion gap: 10 (ref 5–15)
BUN: 7 mg/dL (ref 6–20)
CO2: 23 mmol/L (ref 22–32)
Calcium: 9.8 mg/dL (ref 8.9–10.3)
Chloride: 103 mmol/L (ref 98–111)
Creatinine, Ser: 0.96 mg/dL (ref 0.61–1.24)
GFR, Estimated: >60 mL/min
Glucose, Bld: 149 mg/dL — ABNORMAL HIGH (ref 70–99)
Potassium: 3.6 mmol/L (ref 3.5–5.1)
Sodium: 136 mmol/L (ref 135–145)

## 2022-07-04 LAB — URINALYSIS, ROUTINE W REFLEX MICROSCOPIC
Bilirubin Urine: NEGATIVE
Glucose, UA: NEGATIVE mg/dL
Hgb urine dipstick: NEGATIVE
Ketones, ur: NEGATIVE mg/dL
Leukocytes,Ua: NEGATIVE
Nitrite: NEGATIVE
Protein, ur: NEGATIVE mg/dL
Specific Gravity, Urine: 1.006 (ref 1.005–1.030)
pH: 6 (ref 5.0–8.0)

## 2022-07-04 NOTE — ED Notes (Signed)
Pt expressed that they were going to seek care at another hospital.

## 2022-07-04 NOTE — ED Provider Triage Note (Signed)
  Emergency Medicine Provider Triage Evaluation Note  MRN:  [id number]  Arrival date & time: 07/04/22    Medically screening exam initiated at 1:39 AM.   CC:   Back Pain   HPI:  Joseph Dunn is a 26 y.o. year-old male presents to the ED with chief complaint of sudden onset stabbing left flank pain.  Decreased urination.  Denies any injury.  History provided by patient. ROS:  -As included in HPI PE:   Vitals:   07/04/22 0136  BP: (!) 132/92  Pulse: (!) 112  Resp: 18  Temp: 98.7 F (37.1 C)  SpO2: 97%    Non-toxic appearing No respiratory distress  MDM:  Based on signs and symptoms, [place] is highest on my differential. I've ordered labs and CT in triage to expedite lab/diagnostic workup.  Patient was informed that the remainder of the evaluation will be completed by another provider, this initial triage assessment does not replace that evaluation, and the importance of remaining in the ED until their evaluation is complete.    Roxy Horseman, PA-C 07/04/22 [phone]

## 2022-07-04 NOTE — ED Triage Notes (Signed)
Patient reports left lower back pain onset last night worse with movement and changing positions , denies injury , no hematuria or dysuria .

## 2022-07-07 ENCOUNTER — Ambulatory Visit: Payer: BC Managed Care – PPO | Admitting: Family Medicine

## 2022-07-07 NOTE — Patient Instructions (Incomplete)
It was nice seeing you today!  Blood work today.  See me in 3 months or whenever is a good for you.  Stay well, Lorenda Grecco, MD [facility] [phone]  --  Make sure to check out at the front desk before you leave today.  Please arrive at least 15 minutes prior to your scheduled appointments.  If you had blood work today, I will send you a MyChart message or a letter if results are normal. Otherwise, I will give you a call.  If you had a referral placed, they will call you to set up an appointment. Please give us a call if you don't hear back in the next 2 weeks.  If you need additional refills before your next appointment, please call your pharmacy first.  

## 2022-07-07 NOTE — Progress Notes (Deleted)
    SUBJECTIVE:   CHIEF COMPLAINT / HPI:  No chief complaint on file.   ***  PERTINENT  PMH / PSH: Asthma, tobacco use, pneumomediastinum  Patient Care Team: Pcp, No as PCP - General   OBJECTIVE:   There were no vitals taken for this visit.  Physical Exam       No data to display           {Show previous vital signs (optional):23777}  {Labs  Heme  Chem  Endocrine  Serology  Results Review (optional):23779}  ASSESSMENT/PLAN:   No problem-specific Assessment & Plan notes found for this encounter.    No follow-ups on file.   Littie Deeds, MD [facility]

## 2024-04-15 ENCOUNTER — Ambulatory Visit: Admission: EM | Admit: 2024-04-15 | Discharge: 2024-04-15 | Disposition: A | Discharge status: Home / Self Care

## 2024-04-15 ENCOUNTER — Encounter: Payer: Self-pay | Admitting: Emergency Medicine

## 2024-04-15 DIAGNOSIS — K529 Noninfective gastroenteritis and colitis, unspecified: Secondary | ICD-10-CM

## 2024-04-15 DIAGNOSIS — B349 Viral infection, unspecified: Secondary | ICD-10-CM | POA: Diagnosis not present

## 2024-04-15 NOTE — ED Provider Notes (Signed)
 [facility]    CSN: [id number] Arrival date & time: 04/15/24  1238      History   Chief Complaint Chief Complaint  Patient presents with   Headache   Abdominal Pain   Diarrhea    HPI [place] M[facility]aren is a 28 y.o. male.  3 day history of headaches, abdominal discomfort and diarrhea. Stool is a little bit firmer today. No blood.  No nausea/vomiting Denies fever Not having congestion, sore throat, rash, congestion or cough   Sick contacts at work possible  Has tried tylenol  325 mg  Needs a note for his work  History of pneumomediastinum  He is not having chest pain or tightness   Past Medical History:  Diagnosis Date   Asthma    mostly in childhood   Hypokalemia    history of low potassium per pt   Pneumomediastinum [facility])     Patient Active Problem List   Diagnosis Date Noted   Asthma in adult, mild intermittent, uncomplicated 02/11/2022   Tobacco dependence 02/11/2022   Marijuana abuse 02/11/2022   Pneumomediastinum (HCC) 04/09/2016    Past Surgical History:  Procedure Laterality Date   ESOPHAGOGASTRODUODENOSCOPY     per pt report       Home Medications    Prior to Admission medications   Medication Sig Start Date End Date Taking? Authorizing Provider  acetaminophen  (TYLENOL ) 325 MG tablet Take 2 tablets (650 mg total) by mouth every 6 (six) hours as needed for mild pain (or Fever >/= 101). 02/12/22   Elgergawy, Brayton RAMAN, MD  albuterol  (VENTOLIN  HFA) 108 (90 Base) MCG/ACT inhaler Inhale 2 puffs into the lungs every 6 (six) hours as needed for wheezing or shortness of breath. 10/12/21   Mayers, Cari S, PA-C  Ascorbic Acid (VITAMIN C PO) Take 1 tablet by mouth daily.    [provider]  cetirizine  (ZYRTEC ) 10 MG tablet Take 1 tablet (10 mg total) by mouth daily. Patient not taking: Reported on 02/11/2022 10/12/21   Mayers, Cari S, PA-C  docusate sodium  (COLACE) 100 MG capsule Take 1 capsule (100 mg total) by mouth 2 (two) times  daily. 02/12/22   Elgergawy, Brayton RAMAN, MD  nicotine  (NICODERM CQ  - DOSED IN MG/24 HOURS) 21 mg/24hr patch Place 1 patch (21 mg total) onto the skin daily. 02/13/22   Elgergawy, Brayton RAMAN, MD  oxyCODONE  (OXY IR/ROXICODONE ) 5 MG immediate release tablet Take 1 tablet (5 mg total) by mouth every 4 (four) hours as needed for moderate pain. 02/12/22   Elgergawy, Brayton RAMAN, MD  pantoprazole  (PROTONIX ) 40 MG tablet Take 1 tablet (40 mg total) by mouth daily. 04/13/16 11/14/20  Abrol, Nayana, MD    Family History Family History  Problem Relation Age of Onset   Healthy Mother     Social History Social History   Tobacco Use   Smoking status: Some Days    Types: E-cigarettes, Cigarettes   Smokeless tobacco: Never   Tobacco comments:    Mainly vapes; 1 pack cigs/week  Vaping Use   Vaping status: Every Day  Substance Use Topics   Alcohol use: Yes    Comment: occasional   Drug use: Yes    Types: Marijuana    Comment: a couple of times a week     Allergies   Latex and Ms contin  [morphine ]   Review of Systems Review of Systems As per HPI  Physical Exam Triage Vital Signs ED Triage Vitals  Encounter Vitals Group  BP      Girls Systolic BP Percentile      Girls Diastolic BP Percentile      Boys Systolic BP Percentile      Boys Diastolic BP Percentile      Pulse      Resp      Temp      Temp src      SpO2      Weight      Height      Head Circumference      Peak Flow      Pain Score      Pain Loc      Pain Education      Exclude from Growth Chart    No data found.  Updated Vital Signs BP (!) 141/80 (BP Location: Left Arm)   Pulse 89   Temp 98.6 F (37 C) (Oral)   Resp 16   Wt 150 lb (68 kg)   SpO2 97%   BMI 22.81 kg/m   Visual Acuity Right Eye Distance:   Left Eye Distance:   Bilateral Distance:    Right Eye Near:   Left Eye Near:    Bilateral Near:     Physical Exam Vitals and nursing note reviewed.  Constitutional:      Appearance: Normal appearance.   HENT:     Mouth/Throat:     Mouth: Mucous membranes are moist.     Pharynx: Oropharynx is clear.  Eyes:     Conjunctiva/sclera: Conjunctivae normal.  Cardiovascular:     Rate and Rhythm: Normal rate and regular rhythm.     Heart sounds: Normal heart sounds.  Pulmonary:     Effort: Pulmonary effort is normal. No respiratory distress.     Breath sounds: Normal breath sounds.  Abdominal:     General: Bowel sounds are normal.     Palpations: Abdomen is soft.     Tenderness: There is no abdominal tenderness. There is no right CVA tenderness, left CVA tenderness, guarding or rebound.  Musculoskeletal:        General: Normal range of motion.  Skin:    General: Skin is warm and dry.  Neurological:     Mental Status: He is alert and oriented to person, place, and time.      UC Treatments / Results  Labs (all labs ordered are listed, but only abnormal results are displayed) Labs Reviewed - No data to display  EKG   Radiology No results found.  Procedures Procedures (including critical care time)  Medications Ordered in UC Medications - No data to display  Initial Impression / Assessment and Plan / UC Course  I have reviewed the triage vital signs and the nursing notes.  Pertinent labs & imaging results that were available during my care of the patient were reviewed by me and considered in my medical decision making (see chart for details).  Afebrile in clinic today  Discussed supportive care for viral illness Importance of increasing fluids and the relationship of dehydration and headaches. Tylenol  dosing discussed. Advised reasons to return or be seen in ED. Note for work provided.   Final Clinical Impressions(s) / UC Diagnoses   Final diagnoses:  Viral illness  Gastroenteritis     Discharge Instructions      Increase water intake to minimum of 64 oz daily Continue tylenol  you can three of the 325 mg tablets (total 975 mg) every 6-8 hours. Try bland foods to  help firm up the stool Please  return if needed     ED Prescriptions   None    PDMP not reviewed this encounter.   Jyssica Rief, [place], PA-C 04/15/24 1323

## 2024-04-15 NOTE — ED Triage Notes (Signed)
 Pt presents c/o abd pain, headaches, and diarrhea x 3 days. Pt reports he has not came in contact with any known allergens and has not experienced this feeling before. Pt denies emesis.

## 2024-04-15 NOTE — Discharge Instructions (Signed)
 Increase water intake to minimum of 64 oz daily Continue tylenol  you can three of the 325 mg tablets (total 975 mg) every 6-8 hours. Try bland foods to help firm up the stool Please return if needed
